# Patient Record
Sex: Male | Born: 1965 | Race: White | Hispanic: No | Marital: Married | Smoking: Never smoker
Health system: Southern US, Community
[De-identification: ages and names within clinical notes are randomized; demographics above are authoritative.]

## PROBLEM LIST (undated history)

## (undated) DIAGNOSIS — I2119 ST elevation (STEMI) myocardial infarction involving other coronary artery of inferior wall: Secondary | ICD-10-CM

## (undated) DIAGNOSIS — Z8673 Personal history of transient ischemic attack (TIA), and cerebral infarction without residual deficits: Secondary | ICD-10-CM

## (undated) DIAGNOSIS — M47815 Spondylosis without myelopathy or radiculopathy, thoracolumbar region: Secondary | ICD-10-CM

## (undated) DIAGNOSIS — E785 Hyperlipidemia, unspecified: Secondary | ICD-10-CM

## (undated) DIAGNOSIS — E119 Type 2 diabetes mellitus without complications: Secondary | ICD-10-CM

## (undated) DIAGNOSIS — I5032 Chronic diastolic (congestive) heart failure: Secondary | ICD-10-CM

## (undated) DIAGNOSIS — M479 Spondylosis, unspecified: Secondary | ICD-10-CM

## (undated) DIAGNOSIS — E1140 Type 2 diabetes mellitus with diabetic neuropathy, unspecified: Secondary | ICD-10-CM

## (undated) DIAGNOSIS — I2510 Atherosclerotic heart disease of native coronary artery without angina pectoris: Secondary | ICD-10-CM

## (undated) DIAGNOSIS — K219 Gastro-esophageal reflux disease without esophagitis: Secondary | ICD-10-CM

## (undated) DIAGNOSIS — Z9861 Coronary angioplasty status: Principal | ICD-10-CM

## (undated) DIAGNOSIS — I255 Ischemic cardiomyopathy: Secondary | ICD-10-CM

## (undated) DIAGNOSIS — I10 Essential (primary) hypertension: Secondary | ICD-10-CM

## (undated) HISTORY — DX: Spondylosis, unspecified: M47.9

## (undated) HISTORY — PX: TRANSTHORACIC ECHOCARDIOGRAM: SHX275

## (undated) HISTORY — DX: Spondylosis without myelopathy or radiculopathy, thoracolumbar region: M47.815

## (undated) HISTORY — DX: Atherosclerotic heart disease of native coronary artery without angina pectoris: I25.10

## (undated) HISTORY — DX: Ischemic cardiomyopathy: I25.5

## (undated) HISTORY — DX: Essential (primary) hypertension: I10

## (undated) HISTORY — DX: Type 2 diabetes mellitus with diabetic neuropathy, unspecified: E11.40

## (undated) HISTORY — DX: ST elevation (STEMI) myocardial infarction involving other coronary artery of inferior wall: I21.19

## (undated) HISTORY — DX: Hyperlipidemia, unspecified: E78.5

## (undated) HISTORY — DX: Gastro-esophageal reflux disease without esophagitis: K21.9

## (undated) HISTORY — DX: Chronic diastolic (congestive) heart failure: I50.32

## (undated) HISTORY — DX: Coronary angioplasty status: Z98.61

## (undated) HISTORY — DX: Personal history of transient ischemic attack (TIA), and cerebral infarction without residual deficits: Z86.73

## (undated) HISTORY — DX: Type 2 diabetes mellitus without complications: E11.9

---

## 2010-02-17 DIAGNOSIS — Z8673 Personal history of transient ischemic attack (TIA), and cerebral infarction without residual deficits: Secondary | ICD-10-CM

## 2010-02-17 HISTORY — DX: Personal history of transient ischemic attack (TIA), and cerebral infarction without residual deficits: Z86.73

## 2014-07-17 ENCOUNTER — Encounter (HOSPITAL_BASED_OUTPATIENT_CLINIC_OR_DEPARTMENT_OTHER): Payer: Self-pay | Admitting: Emergency Medicine

## 2014-07-17 ENCOUNTER — Emergency Department (HOSPITAL_BASED_OUTPATIENT_CLINIC_OR_DEPARTMENT_OTHER)
Admission: EM | Admit: 2014-07-17 | Discharge: 2014-07-17 | Disposition: A | Discharge status: Home / Self Care | Payer: BC Managed Care – PPO | Attending: Emergency Medicine | Admitting: Emergency Medicine

## 2014-07-17 DIAGNOSIS — Z79899 Other long term (current) drug therapy: Secondary | ICD-10-CM | POA: Insufficient documentation

## 2014-07-17 DIAGNOSIS — Z8673 Personal history of transient ischemic attack (TIA), and cerebral infarction without residual deficits: Secondary | ICD-10-CM | POA: Insufficient documentation

## 2014-07-17 DIAGNOSIS — E7800 Pure hypercholesterolemia, unspecified: Secondary | ICD-10-CM | POA: Insufficient documentation

## 2014-07-17 DIAGNOSIS — R21 Rash and other nonspecific skin eruption: Secondary | ICD-10-CM | POA: Insufficient documentation

## 2014-07-17 DIAGNOSIS — Z794 Long term (current) use of insulin: Secondary | ICD-10-CM | POA: Insufficient documentation

## 2014-07-17 DIAGNOSIS — B029 Zoster without complications: Secondary | ICD-10-CM

## 2014-07-17 DIAGNOSIS — E1149 Type 2 diabetes mellitus with other diabetic neurological complication: Secondary | ICD-10-CM | POA: Insufficient documentation

## 2014-07-17 DIAGNOSIS — R109 Unspecified abdominal pain: Secondary | ICD-10-CM | POA: Insufficient documentation

## 2014-07-17 MED ORDER — VALACYCLOVIR HCL 1 G PO TABS: 1000.0000 mg | ORAL_TABLET | Freq: Three times a day (TID) | ORAL | Status: DC

## 2014-07-17 NOTE — ED Provider Notes (Signed)
CSN: [id number]     Arrival date & time 07/17/14  1859 History  This chart was scribed for Audree Camel, MD by Luisa Dago, ED Scribe. This patient was seen in room MH10/MH10 and the patient's care was started at 8:31 PM.    Chief Complaint  Patient presents with  . Rash    The history is provided by the patient. No language interpreter was used.   HPI Comments: Jonathan Clayton is a 48 y.o. male with a history of DM presents to the Emergency Department complaining of a worsening rash that he first noted approximately 2 days ago. Pt states that his symptoms started with a sharp pain to his abdomen and radiated up his back one week ago. Then days later he noticed a rash developing along the area of pain. Pt states that it hurts to lay on his back. Jonathan Clayton states that the pain is worsened by touching. He reports taking Zantac with no relief. Denies being on any steroids, fever, chills, nausea, or emesis. Has diabetes.  Past Medical History  Diagnosis Date  . Diabetes mellitus without complication   . Diabetic neuropathy   . Stroke   . High cholesterol    History reviewed. No pertinent past surgical history. No family history on file. History  Substance Use Topics  . Smoking status: Never Smoker   . Smokeless tobacco: Not on file  . Alcohol Use: No    Review of Systems  Constitutional: Negative for fever and chills.  Cardiovascular: Negative for chest pain.  Gastrointestinal: Negative for nausea, vomiting and abdominal pain.  Genitourinary: Positive for flank pain. Negative for dysuria and hematuria.  Skin: Positive for rash.  All other systems reviewed and are negative.  Allergies  Review of patient's allergies indicates no known allergies.  Home Medications   Prior to Admission medications   Medication Sig Start Date End Date Taking? Authorizing Provider  carvedilol (COREG) 25 MG tablet Take 25 mg by mouth 2 (two) times daily with a meal.   Yes Historical Provider, MD   gabapentin (NEURONTIN) 600 MG tablet Take 600 mg by mouth 2 (two) times daily.   Yes Historical Provider, MD  Insulin Lispro, Human, (HUMALOG [place]) Inject into the skin.   Yes Historical Provider, MD  lisinopril (PRINIVIL,ZESTRIL) 40 MG tablet Take 40 mg by mouth daily.   Yes Historical Provider, MD  lovastatin (MEVACOR) 40 MG tablet Take 40 mg by mouth at bedtime.   Yes Historical Provider, MD  ranitidine (ZANTAC) 75 MG tablet Take 75 mg by mouth 2 (two) times daily.   Yes Historical Provider, MD   BP 220/112  Pulse 89  Temp(Src) 98.8 F (37.1 C) (Oral)  Resp 20  Ht 5\' 11"  (1.803 m)  Wt 240 lb (108.863 kg)  BMI 33.49 kg/m2  SpO2 98%  Physical Exam  Nursing note and vitals reviewed. Constitutional: He is oriented to person, place, and time. He appears well-developed and well-nourished. No distress.  HENT:  Head: Normocephalic and atraumatic.  Cardiovascular: Normal rate.   Pulmonary/Chest: Effort normal. No respiratory distress.  Abdominal: There is tenderness (minimal tenderness overlying rash).  Neurological: He is alert and oriented to person, place, and time.  Skin: Skin is warm and dry. Rash noted.  Patchy vesicular rash c/w shingles from right low back to right lower quadrant. No open sores.  Psychiatric: He has a normal mood and affect. His behavior is normal.    ED Course  Procedures (including critical care time)  DIAGNOSTIC STUDIES: Oxygen Saturation is 98% on RA, normal by my interpretation.    COORDINATION OF CARE: 8:34 PM- Advised pt to take Tylenol for pain control. Pt advised of plan for treatment and pt agrees.  Labs Review Labs Reviewed - No data to display  Imaging Review No results found.   EKG Interpretation None      MDM   Final diagnoses:  Shingles rash    Patient's rash is consistent with varicella zoster. His symptoms started overall one week ago with vague pain. He is not having any urinary symptoms. His pain overlies only the rash. At  this time we'll treat with valacyclovir recommend followup with his PCP. Patient is also on hypertensive medications for known hypertension. His blood pressure was 220/112 he is currently not having any symptoms. At this time I will recommend that he continue to take his medicines as prescribed and follow up with his PCP.  I personally performed the services described in this documentation, which was scribed in my presence. The recorded information has been reviewed and is accurate.    Audree CamelScott T Malan Werk, MD 07/17/14 [phone]

## 2014-07-17 NOTE — ED Notes (Signed)
C/o pain to rt side   At times radiating into rt abd and rt back  X 1.5 weeks   w rash onset about 1 week ago  ? Swelling to rt side

## 2014-07-17 NOTE — ED Notes (Addendum)
Pt states he is stressed and is due to bp meds  md aware

## 2014-07-17 NOTE — ED Notes (Signed)
C/o rash and pain to right side of abd x 1-2 weeks

## 2014-07-17 NOTE — Discharge Instructions (Signed)

## 2014-08-07 ENCOUNTER — Emergency Department (HOSPITAL_BASED_OUTPATIENT_CLINIC_OR_DEPARTMENT_OTHER): Payer: BC Managed Care – PPO

## 2014-08-07 ENCOUNTER — Encounter (HOSPITAL_BASED_OUTPATIENT_CLINIC_OR_DEPARTMENT_OTHER): Payer: Self-pay | Admitting: Emergency Medicine

## 2014-08-07 ENCOUNTER — Other Ambulatory Visit: Payer: Self-pay

## 2014-08-07 ENCOUNTER — Ambulatory Visit (HOSPITAL_COMMUNITY): Admit: 2014-08-07 | Payer: Self-pay | Admitting: Cardiology

## 2014-08-07 ENCOUNTER — Encounter (HOSPITAL_COMMUNITY)
Admission: EM | Disposition: A | Discharge status: Home / Self Care | Payer: BC Managed Care – PPO | Source: Home / Self Care | Attending: Cardiology

## 2014-08-07 ENCOUNTER — Inpatient Hospital Stay (HOSPITAL_BASED_OUTPATIENT_CLINIC_OR_DEPARTMENT_OTHER)
Admission: EM | Admit: 2014-08-07 | Discharge: 2014-08-11 | DRG: 246 | Disposition: A | Discharge status: Home / Self Care | Payer: BC Managed Care – PPO | Attending: Cardiology | Admitting: Cardiology

## 2014-08-07 DIAGNOSIS — S80862A Insect bite (nonvenomous), left lower leg, initial encounter: Secondary | ICD-10-CM

## 2014-08-07 DIAGNOSIS — S90569A Insect bite (nonvenomous), unspecified ankle, initial encounter: Secondary | ICD-10-CM | POA: Diagnosis present

## 2014-08-07 DIAGNOSIS — I2510 Atherosclerotic heart disease of native coronary artery without angina pectoris: Secondary | ICD-10-CM | POA: Insufficient documentation

## 2014-08-07 DIAGNOSIS — E1142 Type 2 diabetes mellitus with diabetic polyneuropathy: Secondary | ICD-10-CM | POA: Diagnosis present

## 2014-08-07 DIAGNOSIS — T63391A Toxic effect of venom of other spider, accidental (unintentional), initial encounter: Secondary | ICD-10-CM | POA: Diagnosis present

## 2014-08-07 DIAGNOSIS — I2109 ST elevation (STEMI) myocardial infarction involving other coronary artery of anterior wall: Secondary | ICD-10-CM | POA: Diagnosis present

## 2014-08-07 DIAGNOSIS — L0292 Furuncle, unspecified: Secondary | ICD-10-CM

## 2014-08-07 DIAGNOSIS — Z794 Long term (current) use of insulin: Secondary | ICD-10-CM

## 2014-08-07 DIAGNOSIS — W57XXXA Bitten or stung by nonvenomous insect and other nonvenomous arthropods, initial encounter: Secondary | ICD-10-CM | POA: Diagnosis present

## 2014-08-07 DIAGNOSIS — Z8673 Personal history of transient ischemic attack (TIA), and cerebral infarction without residual deficits: Secondary | ICD-10-CM | POA: Diagnosis not present

## 2014-08-07 DIAGNOSIS — I2582 Chronic total occlusion of coronary artery: Secondary | ICD-10-CM | POA: Diagnosis present

## 2014-08-07 DIAGNOSIS — T6391XA Toxic effect of contact with unspecified venomous animal, accidental (unintentional), initial encounter: Secondary | ICD-10-CM | POA: Diagnosis present

## 2014-08-07 DIAGNOSIS — E785 Hyperlipidemia, unspecified: Secondary | ICD-10-CM | POA: Diagnosis present

## 2014-08-07 DIAGNOSIS — E1149 Type 2 diabetes mellitus with other diabetic neurological complication: Secondary | ICD-10-CM | POA: Diagnosis present

## 2014-08-07 DIAGNOSIS — L989 Disorder of the skin and subcutaneous tissue, unspecified: Secondary | ICD-10-CM

## 2014-08-07 DIAGNOSIS — I10 Essential (primary) hypertension: Secondary | ICD-10-CM | POA: Diagnosis present

## 2014-08-07 DIAGNOSIS — I213 ST elevation (STEMI) myocardial infarction of unspecified site: Secondary | ICD-10-CM

## 2014-08-07 DIAGNOSIS — Z9861 Coronary angioplasty status: Secondary | ICD-10-CM

## 2014-08-07 DIAGNOSIS — I2119 ST elevation (STEMI) myocardial infarction involving other coronary artery of inferior wall: Secondary | ICD-10-CM

## 2014-08-07 DIAGNOSIS — R079 Chest pain, unspecified: Secondary | ICD-10-CM | POA: Diagnosis present

## 2014-08-07 DIAGNOSIS — I959 Hypotension, unspecified: Secondary | ICD-10-CM | POA: Diagnosis not present

## 2014-08-07 DIAGNOSIS — Z113 Encounter for screening for infections with a predominantly sexual mode of transmission: Secondary | ICD-10-CM

## 2014-08-07 DIAGNOSIS — E1159 Type 2 diabetes mellitus with other circulatory complications: Secondary | ICD-10-CM

## 2014-08-07 HISTORY — DX: ST elevation (STEMI) myocardial infarction involving other coronary artery of inferior wall: I21.19

## 2014-08-07 HISTORY — PX: PERCUTANEOUS CORONARY STENT INTERVENTION (PCI-S): SHX6016

## 2014-08-07 HISTORY — PX: LEFT HEART CATHETERIZATION WITH CORONARY ANGIOGRAM: SHX5451

## 2014-08-07 HISTORY — DX: Coronary angioplasty status: Z98.61

## 2014-08-07 HISTORY — DX: Atherosclerotic heart disease of native coronary artery without angina pectoris: I25.10

## 2014-08-07 LAB — COMPREHENSIVE METABOLIC PANEL
ALBUMIN: 2.8 g/dL — AB (ref 3.5–5.2)
ALT: 17 U/L (ref 0–53)
ANION GAP: 16 — AB (ref 5–15)
AST: 34 U/L (ref 0–37)
Alkaline Phosphatase: 82 U/L (ref 39–117)
BUN: 19 mg/dL (ref 6–23)
CHLORIDE: 97 meq/L (ref 96–112)
CO2: 22 mEq/L (ref 19–32)
Calcium: 9.7 mg/dL (ref 8.4–10.5)
Creatinine, Ser: 1.3 mg/dL (ref 0.50–1.35)
GFR calc Af Amer: 73 mL/min — ABNORMAL LOW (ref >90)
GFR calc non Af Amer: 63 mL/min — ABNORMAL LOW (ref >90)
Glucose, Bld: 294 mg/dL — ABNORMAL HIGH (ref 70–99)
Potassium: 4.5 mEq/L (ref 3.7–5.3)
Sodium: 135 mEq/L — ABNORMAL LOW (ref 137–147)
TOTAL PROTEIN: 7.4 g/dL (ref 6.0–8.3)
Total Bilirubin: 0.5 mg/dL (ref 0.3–1.2)

## 2014-08-07 LAB — CBC WITH DIFFERENTIAL/PLATELET
BASOS ABS: 0.1 10*3/uL (ref 0.0–0.1)
BASOS PCT: 1 % (ref 0–1)
EOS ABS: 0.1 10*3/uL (ref 0.0–0.7)
Eosinophils Relative: 1 % (ref 0–5)
HCT: 39.8 % (ref 39.0–52.0)
HEMOGLOBIN: 13.6 g/dL (ref 13.0–17.0)
Lymphocytes Relative: 17 % (ref 12–46)
Lymphs Abs: 1.9 10*3/uL (ref 0.7–4.0)
MCH: 30.2 pg (ref 26.0–34.0)
MCHC: 34.2 g/dL (ref 30.0–36.0)
MCV: 88.4 fL (ref 78.0–100.0)
MONOS PCT: 12 % (ref 3–12)
Monocytes Absolute: 1.4 10*3/uL — ABNORMAL HIGH (ref 0.1–1.0)
NEUTROS ABS: 7.8 10*3/uL — AB (ref 1.7–7.7)
Neutrophils Relative %: 69 % (ref 43–77)
Platelets: 323 10*3/uL (ref 150–400)
RBC: 4.5 MIL/uL (ref 4.22–5.81)
RDW: 13.9 % (ref 11.5–15.5)
WBC: 11.2 10*3/uL — ABNORMAL HIGH (ref 4.0–10.5)

## 2014-08-07 LAB — CBC
HEMATOCRIT: 38.3 % — AB (ref 39.0–52.0)
Hemoglobin: 13.2 g/dL (ref 13.0–17.0)
MCH: 30.2 pg (ref 26.0–34.0)
MCHC: 34.5 g/dL (ref 30.0–36.0)
MCV: 87.6 fL (ref 78.0–100.0)
PLATELETS: 323 10*3/uL (ref 150–400)
RBC: 4.37 MIL/uL (ref 4.22–5.81)
RDW: 14 % (ref 11.5–15.5)
WBC: 11 10*3/uL — AB (ref 4.0–10.5)

## 2014-08-07 LAB — PROTIME-INR
INR: 1.17 (ref 0.00–1.49)
PROTHROMBIN TIME: 14.9 s (ref 11.6–15.2)

## 2014-08-07 LAB — TROPONIN I: TROPONIN I: 5.28 ng/mL — AB (ref <0.30)

## 2014-08-07 LAB — APTT: APTT: 35 s (ref 24–37)

## 2014-08-07 LAB — CREATININE, SERUM
CREATININE: 1.02 mg/dL (ref 0.50–1.35)
GFR, EST NON AFRICAN AMERICAN: 85 mL/min — AB (ref >90)

## 2014-08-07 LAB — GLUCOSE, CAPILLARY
Glucose-Capillary: 195 mg/dL — ABNORMAL HIGH (ref 70–99)
Glucose-Capillary: 206 mg/dL — ABNORMAL HIGH (ref 70–99)

## 2014-08-07 LAB — MRSA PCR SCREENING: MRSA by PCR: NEGATIVE

## 2014-08-07 LAB — POCT ACTIVATED CLOTTING TIME: Activated Clotting Time: 501 seconds

## 2014-08-07 SURGERY — LEFT HEART CATHETERIZATION WITH CORONARY ANGIOGRAM
Anesthesia: LOCAL

## 2014-08-07 MED ORDER — ONDANSETRON HCL 4 MG/2ML IJ SOLN
4.0000 mg | Freq: Four times a day (QID) | INTRAMUSCULAR | Status: DC | PRN
  Administered 2014-08-07: 4 mg via INTRAVENOUS
  Filled 2014-08-07: qty 2

## 2014-08-07 MED ORDER — ASPIRIN 81 MG PO CHEW
81.0000 mg | CHEWABLE_TABLET | Freq: Every day | ORAL | Status: DC
  Administered 2014-08-08 – 2014-08-11 (×4): 81 mg via ORAL
  Filled 2014-08-07 (×4): qty 1

## 2014-08-07 MED ORDER — GABAPENTIN 600 MG PO TABS
600.0000 mg | ORAL_TABLET | Freq: Two times a day (BID) | ORAL | Status: DC
  Administered 2014-08-07 – 2014-08-11 (×8): 600 mg via ORAL
  Filled 2014-08-07 (×9): qty 1

## 2014-08-07 MED ORDER — CARVEDILOL 12.5 MG PO TABS
12.5000 mg | ORAL_TABLET | Freq: Once | ORAL | Status: AC
  Administered 2014-08-07: 12.5 mg via ORAL
  Filled 2014-08-07: qty 1

## 2014-08-07 MED ORDER — TIROFIBAN HCL IV 5 MG/100ML
0.1500 ug/kg/min | INTRAVENOUS | Status: AC
  Administered 2014-08-07 (×2): 0.15 ug/kg/min via INTRAVENOUS
  Filled 2014-08-07 (×2): qty 100

## 2014-08-07 MED ORDER — HEPARIN SODIUM (PORCINE) 5000 UNIT/ML IJ SOLN
4000.0000 [IU] | Freq: Once | INTRAMUSCULAR | Status: AC
  Administered 2014-08-07: 4000 [IU] via INTRAVENOUS

## 2014-08-07 MED ORDER — HEPARIN SODIUM (PORCINE) 5000 UNIT/ML IJ SOLN
INTRAMUSCULAR | Status: AC
  Administered 2014-08-07: 4000 [IU] via INTRAVENOUS
  Filled 2014-08-07: qty 1

## 2014-08-07 MED ORDER — HYDRALAZINE HCL 20 MG/ML IJ SOLN
10.0000 mg | INTRAMUSCULAR | Status: DC | PRN
  Administered 2014-08-07: 10 mg via INTRAVENOUS
  Filled 2014-08-07: qty 1

## 2014-08-07 MED ORDER — HEPARIN SODIUM (PORCINE) 5000 UNIT/ML IJ SOLN
5000.0000 [IU] | Freq: Three times a day (TID) | INTRAMUSCULAR | Status: DC
  Administered 2014-08-08 – 2014-08-10 (×9): 5000 [IU] via SUBCUTANEOUS
  Filled 2014-08-07 (×13): qty 1

## 2014-08-07 MED ORDER — ACETAMINOPHEN 325 MG PO TABS
650.0000 mg | ORAL_TABLET | ORAL | Status: DC | PRN
  Administered 2014-08-07 – 2014-08-10 (×3): 650 mg via ORAL
  Filled 2014-08-07 (×3): qty 2

## 2014-08-07 MED ORDER — HEPARIN (PORCINE) IN NACL 100-0.45 UNIT/ML-% IJ SOLN: 1200.0000 [IU]/h | INTRAMUSCULAR | Status: DC

## 2014-08-07 MED ORDER — MORPHINE SULFATE 2 MG/ML IJ SOLN
2.0000 mg | INTRAMUSCULAR | Status: DC | PRN
  Administered 2014-08-07: 2 mg via INTRAVENOUS
  Filled 2014-08-07: qty 1

## 2014-08-07 MED ORDER — CARVEDILOL 12.5 MG PO TABS
12.5000 mg | ORAL_TABLET | Freq: Two times a day (BID) | ORAL | Status: DC
  Administered 2014-08-07: 12.5 mg via ORAL
  Filled 2014-08-07 (×3): qty 1

## 2014-08-07 MED ORDER — CARVEDILOL 25 MG PO TABS
25.0000 mg | ORAL_TABLET | Freq: Two times a day (BID) | ORAL | Status: DC
  Administered 2014-08-08 – 2014-08-11 (×7): 25 mg via ORAL
  Filled 2014-08-07 (×9): qty 1

## 2014-08-07 MED ORDER — INSULIN ASPART 100 UNIT/ML ~~LOC~~ SOLN
2.0000 [IU] | SUBCUTANEOUS | Status: DC
  Administered 2014-08-07: 6 [IU] via SUBCUTANEOUS
  Administered 2014-08-07: 4 [IU] via SUBCUTANEOUS
  Administered 2014-08-08 (×3): 6 [IU] via SUBCUTANEOUS
  Administered 2014-08-08: 4 [IU] via SUBCUTANEOUS
  Administered 2014-08-08: 6 [IU] via SUBCUTANEOUS
  Administered 2014-08-09: 4 [IU] via SUBCUTANEOUS

## 2014-08-07 MED ORDER — FAMOTIDINE 20 MG PO TABS
20.0000 mg | ORAL_TABLET | Freq: Two times a day (BID) | ORAL | Status: DC
  Administered 2014-08-07 – 2014-08-11 (×8): 20 mg via ORAL
  Filled 2014-08-07 (×9): qty 1

## 2014-08-07 MED ORDER — ASPIRIN 81 MG PO CHEW
324.0000 mg | CHEWABLE_TABLET | Freq: Once | ORAL | Status: AC
  Administered 2014-08-07: 324 mg via ORAL
  Filled 2014-08-07: qty 4

## 2014-08-07 MED ORDER — SODIUM CHLORIDE 0.9 % IJ SOLN: 3.0000 mL | INTRAMUSCULAR | Status: DC | PRN

## 2014-08-07 MED ORDER — SODIUM CHLORIDE 0.9 % IJ SOLN
3.0000 mL | Freq: Two times a day (BID) | INTRAMUSCULAR | Status: DC
  Administered 2014-08-08: 09:00:00 via INTRAVENOUS
  Administered 2014-08-08 – 2014-08-11 (×6): 3 mL via INTRAVENOUS

## 2014-08-07 MED ORDER — SODIUM CHLORIDE 0.9 % IV SOLN: 250.0000 mL | INTRAVENOUS | Status: DC | PRN

## 2014-08-07 MED ORDER — SODIUM CHLORIDE 0.9 % IV SOLN
1.0000 mL/kg/h | INTRAVENOUS | Status: AC
  Administered 2014-08-07: 1 mL/kg/h via INTRAVENOUS

## 2014-08-07 MED ORDER — LISINOPRIL 20 MG PO TABS
20.0000 mg | ORAL_TABLET | Freq: Every day | ORAL | Status: DC
  Filled 2014-08-07: qty 1

## 2014-08-07 MED ORDER — TICAGRELOR 90 MG PO TABS
90.0000 mg | ORAL_TABLET | Freq: Two times a day (BID) | ORAL | Status: DC
  Administered 2014-08-07 – 2014-08-11 (×8): 90 mg via ORAL
  Filled 2014-08-07 (×9): qty 1

## 2014-08-07 NOTE — ED Provider Notes (Addendum)
Asked to review ekg on patient after elicited in ros that patient had chest pain beginning two days ago.  History/physical exam/procedure(s) were performed by non-physician practitioner and as supervising physician I was immediately available for consultation/collaboration. I have reviewed all notes and am in agreement with care and plan.   Hilario Quarry, MD 08/07/14 1635   EKG Interpretation  Date/Time:  Friday August 07 2014 14:05:16 EDT Ventricular Rate:  86 PR Interval:  170 QRS Duration: 98 QT Interval:  366 QTC Calculation: 437 R Axis:   30 Text Interpretation:   Critical Test Result: STEMI Normal sinus rhythm Anterior infarct , possibly acute Inferolateral injury pattern  ACUTE MI / STEMI  Abnormal ECG Confirmed by Geneieve Duell MD, Duwayne Heck ([id number]) on 08/07/2014 2:12:54 PM      History/physical exam/procedure(s) were performed by non-physician practitioner and as supervising physician I was immediately available for consultation/collaboration. I have reviewed all notes and am in agreement with care and plan.   Hilario Quarry, MD 08/07/14 [phone]

## 2014-08-07 NOTE — ED Notes (Addendum)
Lab called to notify of Troponin 5.28. Dr Rosalia Hammers made aware.

## 2014-08-07 NOTE — Progress Notes (Signed)
eLink Physician-Brief Progress Note Patient Name: Jonathan Clayton DOB: 03-Sep-1966 MRN: [id number]   Date of Service  08/07/2014  HPI/Events of Note  48 y.o. male with PMHxn of IDDM (w/ neuropathy), HLD, HTN & prio CVA (no residual R side Sx) recently moved to [place].  About  3 d ago had prolonged episode of substernal-upper chest pressure with dyspnea & sweating. About 30+ min. Since then has had increasing exertional dyspnea & intermittent chest pressure, but did not seek medical assistance until today when he went to [facility] for what appears to be a Spider Bite on R foot.  On ROS CP noted - EKG checked with extensive Inferior & Lateral STE. Currently is CP free, but did note dyspnea in ER.  Went to the cath lab with the following findings: Impression:  1. 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 based on the EKG showing inferolateral ST elevations, it was unclear what the culprit lesion was, therefore decision was made to intervene on the circumflex an attempt the LAD if able to wire.   2. Left main is relatively free of disease. LAD has early mid 50% stenosis just before the bifurcation in the D2 and LAD. The LAD is occluded in the midsection of her large septal perforator. D2 is occluded also at a acute bend.   3.The complex mostly courses as a lateral OM 2. There is a small caliber high OM 1 that courses as a ramus intermedius that is disease in both the small in diameter. There is TIMI 1-2 flow, it is too small for PCI.  4. Successful PCI of the mid circumflex OM2 with 2 overlapping DES   5. Successful PCI of mid LAD with suboptimal distal result, potentially due to distal embolization and a small-caliber vessel   6.Residual occluded mid D2 which is a moderate caliber, albeit tortuous vessel. There was faint TIMI 1 flow beyond what appeared to be occlusion after wiring the vessel.   7. Widely patent very large RCA  with distal RPDA E. tandem 60% stenoses. There are at least 3 small PL branches with one large major PL branch   8. Hemodynamics:  - Central AoP: 99/69/83 mmHg  - LVP/EDP: 103/5/9 mmHg  Recommendations:  - Transferred to the ICU for post radial Care. - Aggrastat for 6 hours with hopes to clear up some of the thrombus burden in the distal LAD and diagonal.  - Aspirin plus Brilinta for minimum one year. - Restart multiple medications, will reduce carvedilol to 12.5 mg twice a day, lisinopril to 20 mg daily and hold losartan.  - He is on a statin.  - Check 2-D echocardiogram  - If recurrent anginal symptoms persists, would consider potentially intervening on the diagonal branch in a staged manner.  eICU Interventions  48 yo STEMI s\p cath lab with above recs - cont to follow up in interventional cards recs - monitor of chest pain, may need re-invention if persistent - cont with BBlocker, ACEI, ASA  IDDM - on inuslin at home, hold PO meds - start SSI      Intervention Category Evaluation Type: New Patient Evaluation  Abbie Jablon 08/07/2014, 5:59 PM

## 2014-08-07 NOTE — Progress Notes (Deleted)
Elink MD notified of Pt's current B/P 

## 2014-08-07 NOTE — ED Notes (Signed)
Possible insect bite to left lower leg.  Noted red, bruised area to shin.  No known fever but was sweating this am.

## 2014-08-07 NOTE — Progress Notes (Signed)
ANTICOAGULATION CONSULT NOTE - Initial Consult  Pharmacy Consult for heparin Indication: chest pain/ACS  No Known Allergies  Patient Measurements: Height: 5\' 11"  (180.3 cm) Weight: 240 lb (108.863 kg) IBW/kg (Calculated) : 75.3 Heparin Dosing Weight: 98.5 kg  Vital Signs: Temp: 98.2 F (36.8 C) (08/28 1312) BP: 141/92 mmHg (08/28 1411) Pulse Rate: 86 (08/28 1312)  Labs:  Recent Labs  08/07/14 1415  HGB 13.6  HCT 39.8  PLT 323  APTT 35  LABPROT 14.9  INR 1.17    CrCl is unknown because no creatinine reading has been taken.   Medical History: Past Medical History  Diagnosis Date  . Diabetes mellitus without complication   . Diabetic neuropathy   . Stroke   . High cholesterol     Medications:   (Not in a hospital admission)  Assessment: 48 yo male initially presented due to concern of insect bite.  Pt then mentioned he was having chest pain, Code STEMI called, anticipate transfer to [facility] for cardiac cath. Baseline INR 1.1, CBC wnl.  Pt had aspirin 324 mg and heparin bolus at [facility] at 1415.  Goal of Therapy:  Heparin level 0.3-0.7 units/ml Monitor platelets by anticoagulation protocol: Yes   Plan:  Heparin gtt at 1200 units/hr Daily HL, CBC F/u 6hr heparin level pending cath     Agapito Games, PharmD, BCPS Clinical Pharmacist Pager: [phone] 08/07/2014 2:49 PM

## 2014-08-07 NOTE — ED Notes (Signed)
Pt transferred to [facility] via EMS  

## 2014-08-07 NOTE — CV Procedure (Signed)
CARDIAC CATHETERIZATION AND PERCUTANEOUS CORONARY INTERVENTION REPORT  NAME:  Jonathan Clayton   MRN: [id number] DOB:  Dec 24, 1965   ADMIT DATE: 08/07/2014 Procedure Date: 08/07/2014  INTERVENTIONAL CARDIOLOGIST: Leonie Man, M.D., MS PRIMARY CARE PROVIDER: No PCP Per Patient PRIMARY CARDIOLOGIST: Leonie Man, MD  PATIENT:  Jonathan Clayton is a 48 y.o. male the past medical history of type 2 diabetes on insulin with neuropathy, hypertension hyperlipidemia and prior stroke mild residual right-sided defect. Include [place], [place] with his wife. He states that about 3 or 4 days ago while visiting family in [place], he noted severe substernal/upper chest pressure and pain with dyspnea and sweating it lasted well over a half an hour.  Since then he has been having exertional dyspnea and chest pressure. He did not seek medical advice for this, but his wife indicates that he has been bothered by this ever since the trip back. He Presented to [facility] Emergency room earlier today with complains of what thought was a bug bite on the left leg. Review of systems revealed chest pain. EKG revealed extensive [place] elevations in leads 1, 2, the V2 to V 5.  He was pain-free upon arrival to [facility] ER. Based on the extent of  elevations he was taken urgently to the cardiac catheterization lab.  PRE-OPERATIVE DIAGNOSIS:    Likely Sub-Acute presentation of AnteroLateral STEMI  PROCEDURES PERFORMED:    Left Heart Catheterization with Native Coronary Angiography  via Right RADIAL Artery   PERCUTANEOUS CORONARY INTERVENTION to mid Circumflex-OM 2 tandem 80% stenoses with intervening 50%- Promus Premier DES 2.75 mm x 16 mm and 3.0 mm x 24 mm (postdilated to 3 mm distally at the 25 mm proximally) -- reduced to 0%   PERCUTANEOUS CORONARY INTERVENTION to mid LAD 100% occlusion: 2 overlapping Promus Premier DES 2.25 mm x 20 mm and 2.25 mm 8 mm - postdilated to 2.4 mm - occlusion reduced to 0%, but TIMI 2  flow to apex.  PROCEDURE: The patient was brought to the 2nd [place] Cardiac Catheterization Lab in the fasting state and prepped and draped in the usual sterile fashion for RIGHT RADIAL artery access. A modified Allen's test was performed on the RIGHT wrist demonstrating excellent collateral flow for radial access.   Sterile technique was used including antiseptics, cap, gloves, gown, hand hygiene, mask and sheet. Skin prep: Chlorhexidine.   Consent: Risks of procedure as well as the alternatives and risks of each were explained to the (patient/caregiver). Consent for procedure obtained.   Time Out: Verified patient identification, verified procedure, site/side was marked, verified correct patient position, special equipment/implants available, medications/allergies/relevent history reviewed, required imaging and test results available. Performed.  Access:   RIGHT RADIAL Artery: 6 Fr Sheath -  fluoroscopically guided modified Seldinger Technique (Angiocath Micropuncture Kit)  Radial Cocktail - 10 mL   Left Heart Catheterization: Initially 5 Fr and 6 Fr Catheters advanced or exchanged over a long-exchange safety J-wire; TIG 4.0 catheter advanced first. Very tortuous innominate artery led to difficulty with engaging the coronaries. This led to delay any additional intervention. Additional delay was due to the difficulty in determining the true culprit lesion. Left Coronary Artery Cineangiography: 6Fr  XB LAD guide Catheter  Right Coronary Artery Cineangiography: 6 Fr Jimmye Norman R Guide Catheter   LV Hemodynamics: TIG 4.0  Sheath removed in the cardiac catheterization lab at TR band placement for hemostasis.  TR Band: 1710  Hours; 16 mL air  FINDINGS:  Hemodynamics:  Central AoP: 99/69/83 mmHg   LVP/EDP: 103/5/9 mmHg  Left Ventriculography: Deferred  Coronary Anatomy:  Dominance: Right  Left Main: Large caliber vessel that bifurcates into the LAD and Circumflex. Angiographic  normal the LAD: Large caliber vessel that tapers to a roughly 50% lesion after giving rise to 2 small diagonal branches and the major septal perforators. Just after this there is a bifurcation into the major D2 and a follow on LAD. Both vessels are occluded in the mid vessel just after the bifurcation.    D1: The first diagonal is not worth numbering, the second one is a small-caliber vessel with diffuse disease.  D2: Moderate to large caliber vessel is tortuous and 100% occluded after it gives rise to a proximal branch. Left Circumflex: Large caliber vessel that gives off a small AV groove circumflex branch but really terminates as a large lateral OM 2. There is a more proximal OM1 the courses as a Ramus Intermedius that is small in diameter and diffusely diseased.   OM 2: Large caliber major marginal branch with 2 tandem lesions that are roughly 80% in stenosis with intervening segment of 60%. This is over 35 mm.   RCA: Very large caliber, dominant vessel but is quite tortuous in its course. It bifurcates distally into the Right Posterior Descending Artery (RPDA) and the  Right Posterior AV Groove Branch (RPAV). Mild luminal irregularities, but widely patent.  RPDA: Large caliber vessel that reaches almost all the way to the apex. There are 2 tandem 60% lesions the distal vessel. Otherwise mild luminal irregularities the  RPL Sysytem:The RPAV begins her vessel it gives off 2 small posterior lateral branches and terminates as a large RPL 3 the covers the entire inferolateral wall. There is a smaller RPL4 that comes off of this branch. Minimal luminal irregularities through the entire system.  After reviewing the initial angiography, the actual culprit lesion could not be determined with Severe CX-OM2 and LAD+ D2 disease.  Due to the ambiguity of the presentation & diffuse STE on EKG, the decision was made to initially intervene on the OM lesions with the belief that the LAD & D2 occlusions could  be chronic.  This was after deliberation with interventional colleagues.  The plan was OM PCI followed by attempted wiring of the LAD & PCI if able.  Preparation were made to proceed with PCI on the OM2 lesions first.  Delay to initial device was attributed to a tortuous Innominate Artery making Arterial cannulation difficult.  Next was the difficulty in determining which lesions to intervene on due to ambiguity of anatomy.  Percutaneous Coronary Intervention:  Sheath exchanged for 6 Fr Guide: 6 Fr   XB LAD 3.5 Guidewire: BMW wire in LAD for Guide support, Prowater wire for Cx-OM2.  Lesions #1: Circumflex-OM2, tandem 80% lesions with intervening ~50-60%  Reduced to 0%, TIMI 3 flow pre & post. Predilation Balloon: BS Emerge 2.5 mm x 15 mm;   12 Atm x 20 Sec, Stent #1 : Promus Premier DES  2.75 mm x 16 mm; covering distal 70-80% lesion  14 Atm x 30 Sec, Stent #2 : Promus Premier DES  3.0 mm x 6m; covering proximal 80% lesion to overlap.  14 Atm x 30 Sec,  Inflated at overlap => 14 Atm x 30 Sec, Post-dilation Balloon: BS [place] Emerge 3.25 mm x 12 mm; Inflations at overlap & proximal Stent#2  14 Atm x 30 Sec, -- 2 inflations  Final Diameter: 3.3 mm proximal, 3.0 mm mid &  2.8 mm distal  Post deployment angiography in multiple views, with and without guidewire in place revealed excellent stent deployment and lesion coverage.  There was no evidence of dissection or perforation.  Attention was then turned to the LAD - both the LAD & D2 were successfully wired without difficulty, indicating that the occlusions may be more subacute.  As there were Anterior & Lateral STE on EKG, it was prudent to attempt PCI on the LAD with the potential to stage PCI of the D2 if Symptoms persist.  Lesion #2: Mid LAD - 100%- TIMI 0 flow - reduced to 0 % with TIMI 2 flow (sluggish apical flow)   Predilation Balloon: BS Emerge 2.0 mm x 12 mm;   10 Atm x 30 Sec, Stent #1 : Promus Premier DES  2.25 mm x 3m;  covering the original occlusion.  12 Atm x 30 Sec,  Post inflation revealed distal plaque shift to the distal stent edge not relieved with IC NTG -- a 2nd stent was used Stent #2 : Promus Premier DES  2.25 mm x 8 mm; covering distl stent edge   12 Atm x 30 Sec,  14 Atm x 30 at overlap   16 Atm x 30 at proximal Stent 1  Final Diameter 2.25 distal, 2.45 proximal   MEDICATIONS:  Anesthesia:  Local Lidocaine 2 ml  Sedation:  2 mg IV Versed, 50 mcg IV fentanyl ;   Omnipaque Contrast: 360 ml  Anticoagulation:  IV Heparin 4000 Units ; Angiomax Bolus & drip  Anti-Platelet Agent:  BRILINTA 180 mg Radial Cocktail: 5 mg Verapamil, 400 mcg NTG, 2 ml 2% Lidocaine in 10 ml NS  PATIENT DISPOSITION:    The patient was transferred to the PACU holding area in a hemodynamicaly stable, chest pain free condition.  The patient tolerated the procedure well, and there were no complications.  EBL:   < 10 ml  The patient was stable before, during, and after the procedure.  POST-OPERATIVE DIAGNOSIS:    Severe three-vessel diseasewith 100% occlusion of mid LAD and mid D2 with tandem 80-90% stenoses in mid OM 2. based on the EKG showing inferolateral ST elevations, it was unclear what the culprit lesion was, therefore decision was made to intervene on the circumflex an attempt the LAD if able to wire.  Successful PCI of the mid circumflex OM2 with 2 overlapping DES   Successful PCI of mid LAD with 2 overlapping DES with suboptimal distal result, potentially due to distal embolization and a small-caliber vessel  Residual occluded mid D2 which is a moderate caliber, albeit tortuous vessel. There was faint TIMI 1 flow beyond what appeared to be occlusion after wiring the vessel.   Normal LVEDP.  PLAN OF CARE:  [facility]. Follow Troponin levels.  DAPT x 1 yr; CM & CRH consult  6 hrs of Aggrestat in attempt to recanalize the apical LAD & D2.  If he has  persistent symptoms, would consider staged PCI of D2.  Aspirin plus Brilinta for minimum one year. Restart multiple medications, Carvedilol to 25 mg twice a day, lisinopril reduced to 20 mg daily and hold losartan until his post cath pressures improve.   He is on a statin.   Check 2-D echocardiogram  If BP stable & symptoms do not recur, would consider d/c by Monday AM.  Cannot forget about L Leg spider/bug bite.  Will need to determine if Abx needed or expectant management.   This was not  addressed in the acute setting.  Will defer to rounding team over the weekend.    Leonie Man, M.D., M.S. [facility] [address] [place], [place]  [address]  [phone]  08/07/2014 5:32 PM

## 2014-08-07 NOTE — H&P (Addendum)
History and Physical Interval Note:  NAME:  Jonathan Clayton   MRN: [id number] DOB:  August 23, 1966   ADMIT DATE: 08/07/2014   08/07/2014 3:22 PM  Bardo Giustino is a 48 y.o. 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 ~3 d ago had prolonged episode of SS-upper chest pressure with dyspnea & sweating.  ~30+ min.  Since then has had increasing exertional dyspnea & intermittent chest pressure, but did not seek medical assistance until today when he went to [facility] for what appears to be a Spider Bite on R foot. On ROS CP noted - EKG checked with extensive Inferior & Lateral STE.  Currently is CP free, but did note dyspnea in ER.    No N/V/D + chest pain &shortness of breath with exertio - not @ rest  No PND, orthopnea or edema.  No palpitations, lightheadedness, dizziness, weakness or syncope/near syncope. No TIA/amaurosis fugax symptoms. No melena, hematochezia, hematuria, or epstaxis. No claudication.   Past Medical History  Diagnosis Date  . Diabetes mellitus without complication   . Diabetic neuropathy   . Stroke   . High cholesterol    No past surgical history on file.  FAMHx: No family history on file.  SOCHx:  reports that he has never smoked. He does not have any smokeless tobacco history on file. He reports that he does not drink alcohol or use illicit drugs.  ALLERGIES: No Known Allergies  HOME MEDICATIONS: Prescriptions prior to admission  Medication Sig Dispense Refill  . carvedilol (COREG) 25 MG tablet Take 25 mg by mouth 2 (two) times daily with a meal.      . gabapentin (NEURONTIN) 600 MG tablet Take 600 mg by mouth 2 (two) times daily.      . Insulin Lispro, Human, (HUMALOG Nogal) Inject into the skin.      Marland Kitchen lisinopril (PRINIVIL,ZESTRIL) 40 MG tablet Take 40 mg by mouth daily.      Marland Kitchen lovastatin (MEVACOR) 40 MG tablet Take 40 mg by mouth at bedtime.      . ranitidine (ZANTAC) 75 MG  tablet Take 75 mg by mouth 2 (two) times daily.       Review of Systems  Constitutional: Negative for fever and chills.  Respiratory: Positive for sputum production.   Cardiovascular: Positive for chest pain, orthopnea and PND.       Exertional dyspnea  Gastrointestinal: Positive for nausea.  Musculoskeletal:       Left leg bug bite  All other systems reviewed and are negative.   PHYSICAL EXAM:Blood pressure 141/92, pulse 86, temperature 98.2 F (36.8 C), resp. rate 16, height 5\' 11"  (1.803 m), weight 240 lb (108.863 kg), SpO2 99.00%. General appearance: alert, cooperative, appears stated age, no distress and pale Neck: no adenopathy, no carotid bruit and no JVD Lungs: clear to auscultation bilaterally, normal percussion bilaterally and non-labored Heart: regular rate and rhythm, S1, S2 normal, no murmur, click, rub or gallop and normal apical impulse Abdomen: soft, non-tender; bowel sounds normal; no masses,  no organomegaly Extremities: extremities normal, atraumatic, no cyanosis or edema Pulses: 2+ and symmetric Neurologic: Grossly normal L LE spider bite   Adult ECG Report  Rate: 76 ;  Rhythm: normal sinus rhythm - extensive ST elevations in leads 1, 2, the tube through V5. -- Inferolateral STEMI  IMPRESSION & PLAN  Larry Bartos has presented today for surgery, with  the diagnosis of Anterolateral STEMI.   The various methods of treatment have been discussed with the patient and family.  The patients' history has been reviewed, patient examined, no change in status from most recent note, stable for surgery. I have reviewed the patients' chart and labs. Questions were answered to the patient's satisfaction.  Risks / Complications include, but not limited to: Death, MI, CVA/TIA, VF/VT (with defibrillation), Bradycardia (need for temporary pacer placement), contrast induced nephropathy, bleeding / bruising / hematoma / pseudoaneurysm, vascular or coronary injury (with possible  emergent CT or Vascular Surgery), adverse medication reactions, infection.    After consideration of risks, benefits and other options for treatment, the patient has verbally consented to Procedure(s):  LEFT HEART CATHETERIZATION AND CORONARY ANGIOGRAPHY +/- AD HOC PERCUTANEOUS CORONARY INTERVENTION   as a surgical intervention.   We will proceed with the planned procedure..  Following cath/PCI  --  will need to continue to treat bug bite on left leg.   HARDING,DAVID W [facility] [address] [place], [place]  [address]  [phone]  08/07/2014 3:22 PM

## 2014-08-07 NOTE — ED Notes (Signed)
Report called to [facility] in Cath Lab.

## 2014-08-07 NOTE — ED Provider Notes (Signed)
CSN: [id number]     Arrival date & time 08/07/14  1254 History   First MD Initiated Contact with Patient 08/07/14 1322     Chief Complaint  Patient presents with  . Insect Bite     (Consider location/radiation/quality/duration/timing/severity/associated sxs/prior Treatment) Patient is a 48 y.o. male presenting with chest pain. The history is provided by the patient. No language interpreter was used.  Chest Pain Pain location:  Substernal area, L chest and R chest Pain quality: aching and sharp   Pain radiates to:  Upper back Pain radiates to the back: yes   Pain severity:  Severe Onset quality:  Sudden Duration:  2 days Timing:  Constant Progression:  Resolved Chronicity:  New Context: lifting   Context: not breathing   Relieved by:  Rest Worsened by:  Nothing tried Ineffective treatments:  None tried Associated symptoms: no abdominal pain   Risk factors: diabetes mellitus, high cholesterol and hypertension   Pt presents for a bug bite on leg.   Pt has diabetes and is concerned about bite on leg.  Pt reports he felt sweaty this am and thought he had a fever.   Pt reports 2 days ago he had pain in his chest that went through to his back and both shoulders.  Pt reports pain has resolved.   Some discomfort yesterday.  No pain at all today.  Past Medical History  Diagnosis Date  . Diabetes mellitus without complication   . Diabetic neuropathy   . Stroke   . High cholesterol    No past surgical history on file. No family history on file. History  Substance Use Topics  . Smoking status: Never Smoker   . Smokeless tobacco: Not on file  . Alcohol Use: No    Review of Systems  Cardiovascular: Positive for chest pain.  Gastrointestinal: Negative for abdominal pain.  All other systems reviewed and are negative.     Allergies  Review of patient's allergies indicates no known allergies.  Home Medications   Prior to Admission medications   Medication Sig Start Date End  Date Taking? Authorizing Provider  carvedilol (COREG) 25 MG tablet Take 25 mg by mouth 2 (two) times daily with a meal.    Historical Provider, MD  gabapentin (NEURONTIN) 600 MG tablet Take 600 mg by mouth 2 (two) times daily.    Historical Provider, MD  Insulin Lispro, Human, (HUMALOG ) Inject into the skin.    Historical Provider, MD  lisinopril (PRINIVIL,ZESTRIL) 40 MG tablet Take 40 mg by mouth daily.    Historical Provider, MD  lovastatin (MEVACOR) 40 MG tablet Take 40 mg by mouth at bedtime.    Historical Provider, MD  ranitidine (ZANTAC) 75 MG tablet Take 75 mg by mouth 2 (two) times daily.    Historical Provider, MD   BP 141/92  Pulse 86  Temp(Src) 98.2 F (36.8 C)  Resp 16  Ht  (1.803 m)  Wt 240 lb (108.863 kg)  BMI 33.49 kg/m2  SpO2 99% Physical Exam  Nursing note and vitals reviewed. Constitutional: He is oriented to person, place, and time. He appears well-developed and well-nourished.  HENT:  Head: Normocephalic and atraumatic.  Eyes: Conjunctivae and EOM are normal. Pupils are equal, round, and reactive to light.  Neck: Normal range of motion.  Cardiovascular: Normal rate and regular rhythm.   Pulmonary/Chest: Effort normal.  Abdominal: He exhibits no distension.  Musculoskeletal: Normal range of motion.  Neurological: He is alert and oriented to person, place,  and time.  Skin: Skin is warm. There is erythema.  1cm round purplish discolored area  (hematoma?)  Psychiatric: He has a normal mood and affect.    ED Course  Procedures (including critical care time) Labs Review Labs Reviewed  CBC WITH DIFFERENTIAL  COMPREHENSIVE METABOLIC PANEL  PROTIME-INR  APTT  TROPONIN I    Imaging Review Dg Chest Port 1 View  08/07/2014   CLINICAL DATA:  Chest pain  EXAM: PORTABLE CHEST - 1 VIEW  COMPARISON:  None.  FINDINGS: Cardiac silhouette is normal in size. Normal mediastinal and hilar contours. Clear lungs. No pleural effusion or pneumothorax.  The bony thorax  is intact.  IMPRESSION: No active disease.   Electronically Signed   By: Amie Portland M.D.   On: 08/07/2014 14:25       Dr. Rosalia Hammers in to see and examine.  Stemi called.   Pt given asa, heparin bolus,  Pt to [facility] to see cardiologist   Final diagnoses:  ST elevation myocardial infarction (STEMI), unspecified artery  Insect bite of left leg, initial encounter     Pt had increased wait to EKg.   Pt did not present with chest problem, came in for insect bite and did not mention chest until provider review of systoms.    Lonia Skinner [place], PA-C 08/07/14 1446

## 2014-08-07 NOTE — Brief Op Note (Addendum)
NAME:  Jonathan Clayton   MRN: [id number] DOB:  August 28, 1966   ADMIT DATE: 08/07/2014  Brief Cardia Catheterization Note:  Indication: 1. Anterolateral STEMI  Procedures: 1. Left Heart Catheterization with Native Coroanry Angiography via 6 French Right Radial Artery access  Unable to engage with TIG 4.0 --> LCA: 78fR XB LAD 3.5 guide, RCA: Williams Right guide; LV hemodynamics TIG 4.0  TR Band: 16 ml @ 1710 hrs 2. PCI to mid circumflex-OM 2 tandem 80% stenoses with intervening 50%-  Promus Premier DES 2.75 mm x 16 mm and 3.0 mm x 24 mm (postdilated to 3 mm distally at the 25 mm proximally) -- reduced to 0% 3. PCI to mid LAD 100% occlusion: 2 overlapping Promus Premier DES  2.25 mm x 20 mm and 2.25 mm 8 mm - postdilated to 2.4 mm - occlusion reduced to 0%, but TIMI 2 flow to apex.  Medications:  2 mg Versed IV; 50 mcg Fentanyl IV  Intra-Coronary nitroglycerin 600 mcg Radial Cocktail: 5 mg Verapamil, 400 mcg NTG, 2 ml 2% Lidocaine in 10 ml NS Angiomax Bolus & Infusion  Impression:  Severe three-vessel disease with 100% occlusion of mid LAD and mid D2 with tandem 80-90% stenoses in mid OM 2.  based on the EKG showing inferolateral ST elevations, it was unclear what the culprit lesion was, therefore decision was made to intervene on the circumflex an attempt the LAD if able to wire.  Left main is relatively free of disease. LAD has early mid 50% stenosis just before the bifurcation in the D2 and LAD. The LAD is occluded in the midsection of her large septal perforator. D2 is occluded also at a acute bend.  The complex mostly courses as a lateral OM 2.  There is a small caliber high OM 1 that courses as a ramus intermedius that is disease in both the small in diameter. There is TIMI 1-2 flow, it is too small for PCI.  Successful PCI of the mid circumflex OM2 with 2 overlapping DES   Successful PCI of mid LAD with suboptimal distal result, potentially due to distal embolization and a  small-caliber vessel  Residual occluded mid D2 which is a moderate caliber, albeit tortuous vessel. There was faint TIMI 1 flow beyond what appeared to be occlusion after wiring the vessel.    Widely patent very large RCA with distal RPDA E. tandem 60% stenoses. There are at least 3 small PL branches with one large major PL branch  Hemodynamics:   Central AoP: 99/69/83 mmHg  LVP/EDP: 103/5/9 mmHg  Recommendations:  Admit to CCU,but likely under stepdown orders. Follow Troponin levels.  DAPT x 1 yr; CM & CRH consult  6 hrs of Aggrestat in attempt to recanalize the apical LAD & D2.  If he has persistent symptoms, would consider staged PCI of D2.  Aspirin plus Brilinta for minimum one year. Restart multiple medications, Carvedilol to 25 mg twice a day, lisinopril reduced to 20 mg daily and hold losartan until his post cath pressures improve.   He is on a statin.   Check 2-D echocardiogram  If BP stable & symptoms do not recur, would consider d/c by Monday AM.  Cannot forget about L Leg spider/bug bite.  Will need to determine if Abx needed or expectant management.   This was not addressed in the acute setting.  Will defer to rounding team over the weekend. Full note to follow    Marykay Lex, M.D., M.S. Interventional Cardiologist   Pager #  [phone] 08/07/2014  5:33 PM

## 2014-08-08 DIAGNOSIS — E785 Hyperlipidemia, unspecified: Secondary | ICD-10-CM | POA: Diagnosis present

## 2014-08-08 DIAGNOSIS — I10 Essential (primary) hypertension: Secondary | ICD-10-CM

## 2014-08-08 DIAGNOSIS — I2510 Atherosclerotic heart disease of native coronary artery without angina pectoris: Secondary | ICD-10-CM

## 2014-08-08 DIAGNOSIS — I219 Acute myocardial infarction, unspecified: Secondary | ICD-10-CM

## 2014-08-08 LAB — CBC
HCT: 35.3 % — ABNORMAL LOW (ref 39.0–52.0)
Hemoglobin: 12.2 g/dL — ABNORMAL LOW (ref 13.0–17.0)
MCH: 30 pg (ref 26.0–34.0)
MCHC: 34.6 g/dL (ref 30.0–36.0)
MCV: 86.9 fL (ref 78.0–100.0)
Platelets: 363 10*3/uL (ref 150–400)
RBC: 4.06 MIL/uL — AB (ref 4.22–5.81)
RDW: 14.1 % (ref 11.5–15.5)
WBC: 11.9 10*3/uL — ABNORMAL HIGH (ref 4.0–10.5)

## 2014-08-08 LAB — GLUCOSE, CAPILLARY
GLUCOSE-CAPILLARY: 213 mg/dL — AB (ref 70–99)
GLUCOSE-CAPILLARY: 219 mg/dL — AB (ref 70–99)
Glucose-Capillary: 197 mg/dL — ABNORMAL HIGH (ref 70–99)
Glucose-Capillary: 257 mg/dL — ABNORMAL HIGH (ref 70–99)
Glucose-Capillary: 269 mg/dL — ABNORMAL HIGH (ref 70–99)

## 2014-08-08 LAB — BASIC METABOLIC PANEL
Anion gap: 11 (ref 5–15)
BUN: 17 mg/dL (ref 6–23)
CO2: 22 mEq/L (ref 19–32)
Calcium: 8.7 mg/dL (ref 8.4–10.5)
Chloride: 101 mEq/L (ref 96–112)
Creatinine, Ser: 1.22 mg/dL (ref 0.50–1.35)
GFR calc Af Amer: 79 mL/min — ABNORMAL LOW (ref >90)
GFR, EST NON AFRICAN AMERICAN: 68 mL/min — AB (ref >90)
Glucose, Bld: 203 mg/dL — ABNORMAL HIGH (ref 70–99)
POTASSIUM: 4 meq/L (ref 3.7–5.3)
SODIUM: 134 meq/L — AB (ref 137–147)

## 2014-08-08 MED ORDER — SULFAMETHOXAZOLE-TMP DS 800-160 MG PO TABS
2.0000 | ORAL_TABLET | Freq: Two times a day (BID) | ORAL | Status: DC
  Administered 2014-08-08 – 2014-08-11 (×7): 2 via ORAL
  Filled 2014-08-08 (×8): qty 2

## 2014-08-08 MED ORDER — LISINOPRIL 40 MG PO TABS
40.0000 mg | ORAL_TABLET | Freq: Every day | ORAL | Status: DC
  Administered 2014-08-08 – 2014-08-09 (×2): 40 mg via ORAL
  Filled 2014-08-08 (×3): qty 1

## 2014-08-08 NOTE — Progress Notes (Addendum)
SUBJECTIVE:  Doing well with no CP  OBJECTIVE:   Vitals:   Filed Vitals:   08/08/14 0400 08/08/14 0500 08/08/14 0600 08/08/14 0700  BP: 159/85 150/69 142/80 145/90  Pulse: 92 89 89 87  Temp: 98.3 F (36.8 C)     TempSrc: Oral     Resp: 20 16 21 21   Height:      Weight:      SpO2: 92% 95% 94% 96%   I&O's:   Intake/Output Summary (Last 24 hours) at 08/08/14 7253 Last data filed at 08/08/14 0300  Gross per 24 hour  Intake 1126.85 ml  Output   1000 ml  Net 126.85 ml   TELEMETRY: Reviewed telemetry pt in NSR:     PHYSICAL EXAM General: Well developed, well nourished, in no acute distress Head: Eyes PERRLA, No xanthomas.   Normal cephalic and atramatic  Lungs:   Clear bilaterally to auscultation and percussion. Heart:   HRRR S1 S2 Pulses are 2+ & equal.            No carotid bruit. No JVD.  No abdominal bruits. No femoral bruits. Abdomen: Bowel sounds are positive, abdomen soft and non-tender without masses  Extremities:   No clubbing, cyanosis or edema.  DP +1 Neuro: Alert and oriented X 3. Psych:  Good affect, responds appropriately   LABS: Basic Metabolic Panel:  Recent Labs  66/44/03 1415 08/07/14 1945 08/08/14 0259  NA 135*  --  134*  K 4.5  --  4.0  CL 97  --  101  CO2 22  --  22  GLUCOSE 294*  --  203*  BUN 19  --  17  CREATININE 1.30 1.02 1.22  CALCIUM 9.7  --  8.7   Liver Function Tests:  Recent Labs  08/07/14 1415  AST 34  ALT 17  ALKPHOS 82  BILITOT 0.5  PROT 7.4  ALBUMIN 2.8*   No results found for this basename: LIPASE, AMYLASE,  in the last 72 hours CBC:  Recent Labs  08/07/14 1415 08/07/14 1945 08/08/14 0259  WBC 11.2* 11.0* 11.9*  NEUTROABS 7.8*  --   --   HGB 13.6 13.2 12.2*  HCT 39.8 38.3* 35.3*  MCV 88.4 87.6 86.9  PLT 323 323 363   Cardiac Enzymes:  Recent Labs  08/07/14 1415  TROPONINI 5.28*   BNP: No components found with this basename: POCBNP,  D-Dimer: No results found for this basename: DDIMER,  in the  last 72 hours Hemoglobin A1C: No results found for this basename: HGBA1C,  in the last 72 hours Fasting Lipid Panel: No results found for this basename: CHOL, HDL, LDLCALC, TRIG, CHOLHDL, LDLDIRECT,  in the last 72 hours Thyroid Function Tests: No results found for this basename: TSH, T4TOTAL, FREET3, T3FREE, THYROIDAB,  in the last 72 hours Anemia Panel: No results found for this basename: VITAMINB12, FOLATE, FERRITIN, TIBC, IRON, RETICCTPCT,  in the last 72 hours Coag Panel:   Lab Results  Component Value Date   INR 1.17 08/07/2014    RADIOLOGY: Dg Chest Port 1 View  08/07/2014   CLINICAL DATA:  Chest pain  EXAM: PORTABLE CHEST - 1 VIEW  COMPARISON:  None.  FINDINGS: Cardiac silhouette is normal in size. Normal mediastinal and hilar contours. Clear lungs. No pleural effusion or pneumothorax.  The bony thorax is intact.  IMPRESSION: No active disease.   Electronically Signed   By: Amie Portland M.D.   On: 08/07/2014 14:25      ASSESSMENT:  1.  Acute anterolateral STEMI with cath showing severe 3 vessel ASCAD with occluded mid LAD and mid D2, 80-90% mid OM2, patent RCA with 60% tandem stenoisis of RPDA - s/p PCI of mid left circ OM2 and PCI to the mid LAD with 2 overlapping DES stents.   2.  DM 3.  HTN - elevated 4.  Dyslipidemia 5.  Spider bite ? Brown recluse - it is spreading and very dark blue in color ? necrosis  PLAN:   1.  Continue ASA/Brilinta/BB/ACE I 2.  2D echo today to assess LVF 3.  Transfer to tele bed 4.  Increase Lisinopril to  daily for better BP control 5.  ID consult for insect bite which is enlarging and very dark in color in a diabetic  Quintella Reichert, MD  08/08/2014  8:08 AM

## 2014-08-08 NOTE — Progress Notes (Signed)
EKG CRITICAL VALUE     12 lead EKG performed.  Critical value noted.  Marcy Salvo, RN notified.   Graylin Sperling C, CCT 08/08/2014 7:38 AM

## 2014-08-08 NOTE — Progress Notes (Cosign Needed)
CARDIAC REHAB PHASE I   PRE:  Rate/Rhythm: 86 sinus rhythm  BP:  Supine:   Sitting: 120/80  Standing:    SaO2: 97% ra  MODE:  Ambulation: 530 ft   POST:  Rate/Rhythem: 88 sinus rhythm  BP:  Supine:   Sitting: 124/60  Standing:    SaO2: 98% ra  1215-1250  Pt ambulated in hallway using rolling walker.  Slow steady gait.  Pt uses cane at home for diabetic neuropathy.  Pt education completed including medication compliance, NTG use, diabetic control, diet and exercise. Pt oriented to CRPII.  At pt request, referral sent to [facility].  Pt verbalized understanding  Jonathan Clayton, [employer]

## 2014-08-08 NOTE — Progress Notes (Signed)
Chaplain Note: Responded to request by RN to provide some hotel information to patient's family needing a place to stay. Spoke with patient's family on the phone and suggested several hotel options. Follow up recommended to see if family may be candidates for [facility]. Rutherford Nail, Chaplain

## 2014-08-08 NOTE — Consult Note (Signed)
[facility]    Date of Admission:  08/07/2014  Date of Consult:  08/08/2014  Reason for Consult: spider bite, skin infection Referring Physician: Dr. Radford Pax  HPI: Jonathan Clayton is an 48 y.o. male with DM, HTN, HLD, prior CVA admitted after 3 days of chest pressure and found to have anterolateral STEMI sp cardiac cath and PCI of circumflex, LAD. In interim he revealed history of skin lesion on leg that he thought was spider bite that has continued to grow in size. There has been no purulent drainage. He has had no prior hx of boils. He has no fevers or chills or malaise. He has a dog but no cats , no scratches near the area, no dermatological or Rheumatological PMHX.   Past Medical History  Diagnosis Date  . Diabetes mellitus without complication   . Diabetic neuropathy   . Stroke   . High cholesterol     History reviewed. No pertinent past surgical history.ergies:   No Known Allergies   Medications: I have reviewed patients current medications as documented in Epic Anti-infectives   Start     Dose/Rate Route Frequency Ordered Stop   08/08/14 1400  sulfamethoxazole-trimethoprim (BACTRIM DS) 800-160 MG per tablet 2 tablet     2 tablet Oral Every 12 hours 08/08/14 1251        Social History:  reports that he has never smoked. He does not have any smokeless tobacco history on file. He reports that he does not drink alcohol or use illicit drugs.  No family history on file.  As in HPI and primary teams notes otherwise 12 point review of systems is negative  Blood pressure 144/86, pulse 86, temperature 98.4 F (36.9 C), temperature source Oral, resp. rate 16, height _0  (1.803 m), weight 230 lb (104.327 kg), SpO2 97.00%. General: Alert and awake, oriented x3, not in any acute distress. HEENT: anicteric sclera, pupils reactive to light and accommodation, EOMI, oropharynx clear and without exudate CVS regular rate, normal r,  no murmur rubs or  gallops Chest: clear to auscultation bilaterally, no wheezing, rales or rhonchi Abdomen: soft nontender, nondistended, normal bowel sounds,  Skin:    Raised violaceous  tender lesion:       Neuro: nonfocal, strength and sensation intact   Results for orders placed during the hospital encounter of 08/07/14 (from the past 48 hour(s))  CBC WITH DIFFERENTIAL     Status: Abnormal   Collection Time    08/07/14  2:15 PM      Result Value Ref Range   WBC 11.2 (*) 4.0 - 10.5 K/uL   RBC 4.50  4.22 - 5.81 MIL/uL   Hemoglobin 13.6  13.0 - 17.0 g/dL   HCT 39.8  39.0 - 52.0 %   MCV 88.4  78.0 - 100.0 fL   MCH 30.2  26.0 - 34.0 pg   MCHC 34.2  30.0 - 36.0 g/dL   RDW 13.9  11.5 - 15.5 %   Platelets 323  150 - 400 K/uL   Neutrophils Relative % 69  43 - 77 %   Neutro Abs 7.8 (*) 1.7 - 7.7 K/uL   Lymphocytes Relative 17  12 - 46 %   Lymphs Abs 1.9  0.7 - 4.0 K/uL   Monocytes Relative 12  3 - 12 %   Monocytes Absolute 1.4 (*) 0.1 - 1.0 K/uL   Eosinophils Relative 1  0 - 5 %   Eosinophils Absolute 0.1  0.0 -  0.7 K/uL   Basophils Relative 1  0 - 1 %   Basophils Absolute 0.1  0.0 - 0.1 K/uL  COMPREHENSIVE METABOLIC PANEL     Status: Abnormal   Collection Time    08/07/14  2:15 PM      Result Value Ref Range   Sodium 135 (*) 137 - 147 mEq/L   Potassium 4.5  3.7 - 5.3 mEq/L   Chloride 97  96 - 112 mEq/L   CO2 22  19 - 32 mEq/L   Glucose, Bld 294 (*) 70 - 99 mg/dL   BUN 19  6 - 23 mg/dL   Creatinine, Ser 1.30  0.50 - 1.35 mg/dL   Calcium 9.7  8.4 - 10.5 mg/dL   Total Protein 7.4  6.0 - 8.3 g/dL   Albumin 2.8 (*) 3.5 - 5.2 g/dL   AST 34  0 - 37 U/L   ALT 17  0 - 53 U/L   Alkaline Phosphatase 82  39 - 117 U/L   Total Bilirubin 0.5  0.3 - 1.2 mg/dL   GFR calc non Af Amer 63 (*) >90 mL/min   GFR calc Af Amer 73 (*) >90 mL/min   Comment: (NOTE)     The eGFR has been calculated using the CKD EPI equation.     This calculation has not been validated in all clinical situations.      eGFR's persistently <90 mL/min signify possible Chronic Kidney     Disease.   Anion gap 16 (*) 5 - 15  PROTIME-INR     Status: None   Collection Time    08/07/14  2:15 PM      Result Value Ref Range   Prothrombin Time 14.9  11.6 - 15.2 seconds   INR 1.17  0.00 - 1.49  APTT     Status: None   Collection Time    08/07/14  2:15 PM      Result Value Ref Range   aPTT 35  24 - 37 seconds  TROPONIN I     Status: Abnormal   Collection Time    08/07/14  2:15 PM      Result Value Ref Range   Troponin I 5.28 (*) <0.30 ng/mL   Comment: CRITICAL RESULT CALLED TO, READ BACK BY AND VERIFIED WITH:     Phyllis Ginger RN _0  08/07/14 OLSONM                Due to the release kinetics of cTnI,     a negative result within the first hours     of the onset of symptoms does not rule out     myocardial infarction with certainty.     If myocardial infarction is still suspected,     repeat the test at appropriate intervals.  POCT ACTIVATED CLOTTING TIME     Status: None   Collection Time    08/07/14  4:17 PM      Result Value Ref Range   Activated Clotting Time 501    MRSA PCR SCREENING     Status: None   Collection Time    08/07/14  5:53 PM      Result Value Ref Range   MRSA by PCR NEGATIVE  NEGATIVE   Comment:            The GeneXpert MRSA Assay (FDA     approved for NASAL specimens     only), is one component of a     comprehensive MRSA  colonization     surveillance program. It is not     intended to diagnose MRSA     infection nor to guide or     monitor treatment for     MRSA infections.  CBC     Status: Abnormal   Collection Time    08/07/14  7:45 PM      Result Value Ref Range   WBC 11.0 (*) 4.0 - 10.5 K/uL   RBC 4.37  4.22 - 5.81 MIL/uL   Hemoglobin 13.2  13.0 - 17.0 g/dL   HCT 38.3 (*) 39.0 - 52.0 %   MCV 87.6  78.0 - 100.0 fL   MCH 30.2  26.0 - 34.0 pg   MCHC 34.5  30.0 - 36.0 g/dL   RDW 14.0  11.5 - 15.5 %   Platelets 323  150 - 400 K/uL  CREATININE, SERUM     Status:  Abnormal   Collection Time    08/07/14  7:45 PM      Result Value Ref Range   Creatinine, Ser 1.02  0.50 - 1.35 mg/dL   GFR calc non Af Amer 85 (*) >90 mL/min   GFR calc Af Amer >90  >90 mL/min   Comment: (NOTE)     The eGFR has been calculated using the CKD EPI equation.     This calculation has not been validated in all clinical situations.     eGFR's persistently <90 mL/min signify possible Chronic Kidney     Disease.  GLUCOSE, CAPILLARY     Status: Abnormal   Collection Time    08/07/14  7:47 PM      Result Value Ref Range   Glucose-Capillary 195 (*) 70 - 99 mg/dL  GLUCOSE, CAPILLARY     Status: Abnormal   Collection Time    08/07/14 11:46 PM      Result Value Ref Range   Glucose-Capillary 206 (*) 70 - 99 mg/dL  BASIC METABOLIC PANEL     Status: Abnormal   Collection Time    08/08/14  2:59 AM      Result Value Ref Range   Sodium 134 (*) 137 - 147 mEq/L   Potassium 4.0  3.7 - 5.3 mEq/L   Chloride 101  96 - 112 mEq/L   CO2 22  19 - 32 mEq/L   Glucose, Bld 203 (*) 70 - 99 mg/dL   BUN 17  6 - 23 mg/dL   Creatinine, Ser 1.22  0.50 - 1.35 mg/dL   Calcium 8.7  8.4 - 10.5 mg/dL   GFR calc non Af Amer 68 (*) >90 mL/min   GFR calc Af Amer 79 (*) >90 mL/min   Comment: (NOTE)     The eGFR has been calculated using the CKD EPI equation.     This calculation has not been validated in all clinical situations.     eGFR's persistently <90 mL/min signify possible Chronic Kidney     Disease.   Anion gap 11  5 - 15  CBC     Status: Abnormal   Collection Time    08/08/14  2:59 AM      Result Value Ref Range   WBC 11.9 (*) 4.0 - 10.5 K/uL   RBC 4.06 (*) 4.22 - 5.81 MIL/uL   Hemoglobin 12.2 (*) 13.0 - 17.0 g/dL   HCT 35.3 (*) 39.0 - 52.0 %   MCV 86.9  78.0 - 100.0 fL   MCH 30.0  26.0 - 34.0 pg  MCHC 34.6  30.0 - 36.0 g/dL   RDW 14.1  11.5 - 15.5 %   Platelets 363  150 - 400 K/uL  GLUCOSE, CAPILLARY     Status: Abnormal   Collection Time    08/08/14  3:59 AM      Result Value  Ref Range   Glucose-Capillary 213 (*) 70 - 99 mg/dL  GLUCOSE, CAPILLARY     Status: Abnormal   Collection Time    08/08/14  8:08 AM      Result Value Ref Range   Glucose-Capillary 197 (*) 70 - 99 mg/dL  GLUCOSE, CAPILLARY     Status: Abnormal   Collection Time    08/08/14 11:10 AM      Result Value Ref Range   Glucose-Capillary 257 (*) 70 - 99 mg/dL   Comment 1 Documented in Chart     Comment 2 Notify RN    GLUCOSE, CAPILLARY     Status: Abnormal   Collection Time    08/08/14  4:52 PM      Result Value Ref Range   Glucose-Capillary 219 (*) 70 - 99 mg/dL   _0 (sdes,specrequest,cult,reptstatus)   ) Recent Results (from the past 720 hour(s))  MRSA PCR SCREENING     Status: None   Collection Time    08/07/14  5:53 PM      Result Value Ref Range Status   MRSA by PCR NEGATIVE  NEGATIVE Final   Comment:            The GeneXpert MRSA Assay (FDA     approved for NASAL specimens     only), is one component of a     comprehensive MRSA colonization     surveillance program. It is not     intended to diagnose MRSA     infection nor to guide or     monitor treatment for     MRSA infections.     Impression/Recommendation  Active Problems:   ST elevation myocardial infarction (STEMI) of inferolateral wall   Benign essential HTN   Dyslipidemia   Jonathan Clayton is a 48 y.o. male with CAD sp CABG, now with AL STEMI and found to have skin lesion.  #1 Skin lesion: Not clear what this is. Sudden onset makes one concerned for MRSA, MSSA infection though appearance is NOT at all typical. An actual brown recluse spider bite is also possible. Finally more unusual pathology including infection with Nocardia could be considered vs vasculitis  I will start oral Bactrim DS two bid  Warm compresses  May benefit from Dermatology consult and or biopsy though the former will NOT be available in house  #2 Screening: check HIV and hep panel.   08/08/2014, 5:44 PM   Thank you so  much for this interesting consult  [facility] [place] [phone] (pager) [phone] (office) 08/08/2014, 5:44 PM  Rhina Br[place] 08/08/2014, 5:44 PM

## 2014-08-09 DIAGNOSIS — I059 Rheumatic mitral valve disease, unspecified: Secondary | ICD-10-CM

## 2014-08-09 DIAGNOSIS — I2119 ST elevation (STEMI) myocardial infarction involving other coronary artery of inferior wall: Secondary | ICD-10-CM

## 2014-08-09 LAB — HEPATITIS PANEL, ACUTE
HCV Ab: NEGATIVE
Hep A IgM: NONREACTIVE
Hep B C IgM: NONREACTIVE
Hepatitis B Surface Ag: NEGATIVE

## 2014-08-09 LAB — BASIC METABOLIC PANEL
ANION GAP: 15 (ref 5–15)
BUN: 17 mg/dL (ref 6–23)
CALCIUM: 8.7 mg/dL (ref 8.4–10.5)
CO2: 19 mEq/L (ref 19–32)
Chloride: 98 mEq/L (ref 96–112)
Creatinine, Ser: 1.32 mg/dL (ref 0.50–1.35)
GFR calc Af Amer: 72 mL/min — ABNORMAL LOW (ref >90)
GFR, EST NON AFRICAN AMERICAN: 62 mL/min — AB (ref >90)
Glucose, Bld: 189 mg/dL — ABNORMAL HIGH (ref 70–99)
POTASSIUM: 4.1 meq/L (ref 3.7–5.3)
SODIUM: 132 meq/L — AB (ref 137–147)

## 2014-08-09 LAB — GLUCOSE, CAPILLARY
GLUCOSE-CAPILLARY: 283 mg/dL — AB (ref 70–99)
GLUCOSE-CAPILLARY: 235 mg/dL — AB (ref 70–99)
GLUCOSE-CAPILLARY: 255 mg/dL — AB (ref 70–99)
Glucose-Capillary: 175 mg/dL — ABNORMAL HIGH (ref 70–99)

## 2014-08-09 LAB — HIV ANTIBODY (ROUTINE TESTING W REFLEX): HIV: NONREACTIVE

## 2014-08-09 MED ORDER — INSULIN ASPART 100 UNIT/ML ~~LOC~~ SOLN: 0.0000 [IU] | Freq: Three times a day (TID) | SUBCUTANEOUS | Status: DC

## 2014-08-09 MED ORDER — INSULIN ASPART 100 UNIT/ML ~~LOC~~ SOLN
0.0000 [IU] | Freq: Three times a day (TID) | SUBCUTANEOUS | Status: DC
  Administered 2014-08-09: 5 [IU] via SUBCUTANEOUS
  Administered 2014-08-09: 3 [IU] via SUBCUTANEOUS
  Administered 2014-08-10: 17:00:00 via SUBCUTANEOUS
  Administered 2014-08-10: 2 [IU] via SUBCUTANEOUS
  Administered 2014-08-10: 7 [IU] via SUBCUTANEOUS
  Administered 2014-08-11: 3 [IU] via SUBCUTANEOUS
  Administered 2014-08-11: 7 [IU] via SUBCUTANEOUS

## 2014-08-09 MED ORDER — HYDROCHLOROTHIAZIDE 25 MG PO TABS
25.0000 mg | ORAL_TABLET | Freq: Every day | ORAL | Status: DC
  Administered 2014-08-09: 25 mg via ORAL
  Filled 2014-08-09 (×2): qty 1

## 2014-08-09 NOTE — Progress Notes (Signed)
UR Completed.  Daulton Harbaugh Jane [phone] 08/09/2014  

## 2014-08-09 NOTE — Progress Notes (Addendum)
SUBJECTIVE:  The patient reports sone PND and orthopnea the last night, now resolved. No CP.   Marland Kitchen aspirin  81 mg Oral Daily  . carvedilol  25 mg Oral BID WC  . famotidine  20 mg Oral BID  . gabapentin  600 mg Oral BID  . heparin  5,000 Units Subcutaneous 3 times per day  . insulin aspart  2-6 Units Subcutaneous 6 times per day  . lisinopril  40 mg Oral Daily  . sodium chloride  3 mL Intravenous Q12H  . sulfamethoxazole-trimethoprim  2 tablet Oral Q12H  . ticagrelor  90 mg Oral BID   OBJECTIVE:    Vitals:   Filed Vitals:   08/08/14 1000 08/08/14 1200 08/08/14 2100 08/09/14 0527  BP: 156/89 144/86 147/95 138/79  Pulse: 89 86 85 83  Temp:  98.4 F (36.9 C) 98.5 F (36.9 C) 98.6 F (37 C)  TempSrc:  Oral    Resp: 19 16 16 16   Height:  5\' 11"  (1.803 m)    Weight:  230 lb (104.327 kg)    SpO2: 94% 97% 95% 96%   I&O's:  No intake or output data in the 24 hours ending 08/09/14 0909  TELEMETRY: Reviewed telemetry pt in NSR:  PHYSICAL EXAM General: Well developed, well nourished, in no acute distress Head: Eyes PERRLA, No xanthomas.   Normal cephalic and atramatic  Lungs:   Minimal rales at the basis. Heart:   HRRR S1 S2 Pulses are 2+ & equal.            No carotid bruit. No JVD.  No abdominal bruits. No femoral bruits. Abdomen: Bowel sounds are positive, abdomen soft and non-tender without masses  Extremities:   No clubbing, cyanosis or edema.  DP +1, dark skin lesion on the left shin Neuro: Alert and oriented X 3. Psych:  Good affect, responds appropriately  LABS: Basic Metabolic Panel:  Recent Labs  88/82/80 0259 08/09/14 0430  NA 134* 132*  K 4.0 4.1  CL 101 98  CO2 22 19  GLUCOSE 203* 189*  BUN 17 17  CREATININE 1.22 1.32  CALCIUM 8.7 8.7   Liver Function Tests:  Recent Labs  08/07/14 1415  AST 34  ALT 17  ALKPHOS 82  BILITOT 0.5  PROT 7.4  ALBUMIN 2.8*    Recent Labs  08/07/14 1415 08/07/14 1945 08/08/14 0259  WBC 11.2* 11.0* 11.9*    NEUTROABS 7.8*  --   --   HGB 13.6 13.2 12.2*  HCT 39.8 38.3* 35.3*  MCV 88.4 87.6 86.9  PLT 323 323 363   Cardiac Enzymes:  Recent Labs  08/07/14 1415  TROPONINI 5.28*   RADIOLOGY: Dg Chest Port 1 View  08/07/2014   CLINICAL DATA:  Chest pain  EXAM: PORTABLE CHEST - 1 VIEW  COMPARISON:  None.  FINDINGS: Cardiac silhouette is normal in size. Normal mediastinal and hilar contours. Clear lungs. No pleural effusion or pneumothorax.  The bony thorax is intact.  IMPRESSION: No active disease.   Electronically Signed   By: Amie Portland M.D.   On: 08/07/2014 14:25    ASSESSMENT:   1.  Acute anterolateral STEMI with cath showing severe 3 vessel CAD with occluded mid LAD and mid D2, 80-90% mid OM2, patent RCA with 60% tandem stenoisis of RPDA - s/p PCI of mid left circ OM2 and PCI to the mid LAD with 2 overlapping DES stents.   2.  DM 3.  HTN - controlled with increased LIsinopril  4.  Dyslipidemia 5.  ? MRSA, MSSA skin infection (left shin)  - started on Bactrim by ID, HIV testing pending 6.  Orthopnea, PND  PLAN:   1.  Continue ASA/Brilinta/BB/ACEI 2.  2D echo today to assess LVF 3.  Add hydrochlorothiazide 25 mg po daily   Lars Masson, MD  08/09/2014  9:09 AM

## 2014-08-09 NOTE — Progress Notes (Signed)
Pt concerned about discharge and not having any resources to buy meds and get help for cardiac condition. Pt and wife would like to have home health assistance but decline any interest in SNF. Pt has recently moved fm [place] area to [place].  Pt states that nobody has discussed discharge with him or his wife. Explained to patient the discharge process and reiterated what ID plan about abcess or insect bite to knee.

## 2014-08-09 NOTE — Progress Notes (Signed)
  Echocardiogram 2D Echocardiogram has been performed.  [place] Co 08/09/2014, 12:14 PM

## 2014-08-09 NOTE — Progress Notes (Signed)
[facility]  Day # 2 bactrim  Subjective: No change in lesion on leg   Antibiotics:  Anti-infectives   Start     Dose/Rate Route Frequency Ordered Stop   08/08/14 1400  sulfamethoxazole-trimethoprim (BACTRIM DS) 800-160 MG per tablet 2 tablet     2 tablet Oral Every 12 hours 08/08/14 1251        Medications: Scheduled Meds: . aspirin  81 mg Oral Daily  . carvedilol  25 mg Oral BID WC  . famotidine  20 mg Oral BID  . gabapentin  600 mg Oral BID  . heparin  5,000 Units Subcutaneous 3 times per day  . hydrochlorothiazide  25 mg Oral Daily  . insulin aspart  2-6 Units Subcutaneous 6 times per day  . lisinopril  40 mg Oral Daily  . sodium chloride  3 mL Intravenous Q12H  . sulfamethoxazole-trimethoprim  2 tablet Oral Q12H  . ticagrelor  90 mg Oral BID   Continuous Infusions:  PRN Meds:.sodium chloride, acetaminophen, hydrALAZINE, morphine injection, ondansetron (ZOFRAN) IV, sodium chloride    Objective: Weight change: -10 lb (-4.536 kg)  Intake/Output Summary (Last 24 hours) at 08/09/14 1158 Last data filed at 08/09/14 1034  Gross per 24 hour  Intake      3 ml  Output      0 ml  Net      3 ml   Blood pressure 134/82, pulse 83, temperature 98.6 F (37 C), temperature source Oral, resp. rate 16, height  (1.803 m), weight 230 lb (104.327 kg), SpO2 96.00%. Temp:  [98.4 F (36.9 C)-98.6 F (37 C)] 98.6 F (37 C) (08/30 0527) Pulse Rate:  [83-86] 83 (08/30 0527) Resp:  [16] 16 (08/30 0527) BP: (134-147)/(79-95) 134/82 mmHg (08/30 1035) SpO2:  [95 %-97 %] 96 % (08/30 0527) Weight:  [230 lb (104.327 kg)] 230 lb (104.327 kg) (08/29 1200)  Physical Exam: General: Alert and awake, oriented x3, not in any acute distress. HEENT:  EOMI Skin:   Skin lesion is not appreciably changed, see sequential pictures, tender esp in the middle of lesion  08/08/14    08/09/14:       Neuro: nonfocal  CBC:  Recent Labs Lab  08/07/14 1415 08/07/14 1945 08/08/14 0259  HGB 13.6 13.2 12.2*  HCT 39.8 38.3* 35.3*  PLT 323 323 363  INR 1.17  --   --   APTT 35  --   --      BMET  Recent Labs  08/08/14 0259 08/09/14 0430  NA 134* 132*  K 4.0 4.1  CL 101 98  CO2 22 19  GLUCOSE 203* 189*  BUN 17 17  CREATININE 1.22 1.32  CALCIUM 8.7 8.7     Liver Panel   Recent Labs  08/07/14 1415  PROT 7.4  ALBUMIN 2.8*  AST 34  ALT 17  ALKPHOS 82  BILITOT 0.5       Sedimentation Rate No results found for this basename: ESRSEDRATE,  in the last 72 hours C-Reactive Protein No results found for this basename: CRP,  in the last 72 hours  Micro Results: Recent Results (from the past 720 hour(s))  MRSA PCR SCREENING     Status: None   Collection Time    08/07/14  5:53 PM      Result Value Ref Range Status   MRSA by PCR NEGATIVE  NEGATIVE Final   Comment:  The GeneXpert MRSA Assay (FDA     approved for NASAL specimens     only), is one component of a     comprehensive MRSA colonization     surveillance program. It is not     intended to diagnose MRSA     infection nor to guide or     monitor treatment for     MRSA infections.    Studies/Results: Dg Chest Port 1 View  08/07/2014   CLINICAL DATA:  Chest pain  EXAM: PORTABLE CHEST - 1 VIEW  COMPARISON:  None.  FINDINGS: Cardiac silhouette is normal in size. Normal mediastinal and hilar contours. Clear lungs. No pleural effusion or pneumothorax.  The bony thorax is intact.  IMPRESSION: No active disease.   Electronically Signed   By: Amie Portland M.D.   On: 08/07/2014 14:25      Assessment/Plan:  Active Problems:   ST elevation myocardial infarction (STEMI) of inferolateral wall   Benign essential HTN   Dyslipidemia    Jonathan Clayton is a 48 y.o. male with  Admission for MI sp cardiac cath and PCI's also with skin lesion on leg, sp bactrim being started yesterday by myself.  #1 Skin lesion: Not clear to  Me that this is  infected. It could INDEED be a brown recluse spider bite in which case the therapy is supportive and requires patience  Some experts use dapsone for spider bites. I started with bactrim DS for better potential strep coverage and also activity vs Nocardia  I would recommend giving him 10 days of TWO DS BID   If he has worsening of the lesion could consider consulting general surgery for biopsy  He also could be seen by Dermatology as an outpatient for consideration of biopsy  With actual brown recluse spider bites surgery can sometimes make the lesions worse  I WOULD INQUIRE INTO HIS TETANUS HX AND IF NO TETANUS VACCINE IN LAST 5 YEARS GIVE TETANUS VACCINE  Dr. Luciana Axe will be here tomorrow.     LOS: 2 days   [name] 08/09/2014, 11:58 AM

## 2014-08-10 DIAGNOSIS — R21 Rash and other nonspecific skin eruption: Secondary | ICD-10-CM

## 2014-08-10 LAB — GLUCOSE, CAPILLARY
GLUCOSE-CAPILLARY: 195 mg/dL — AB (ref 70–99)
GLUCOSE-CAPILLARY: 346 mg/dL — AB (ref 70–99)
Glucose-Capillary: 193 mg/dL — ABNORMAL HIGH (ref 70–99)
Glucose-Capillary: 254 mg/dL — ABNORMAL HIGH (ref 70–99)

## 2014-08-10 MED ORDER — ATORVASTATIN CALCIUM 40 MG PO TABS
40.0000 mg | ORAL_TABLET | Freq: Every day | ORAL | Status: DC
  Administered 2014-08-10: 40 mg via ORAL
  Filled 2014-08-10 (×2): qty 1

## 2014-08-10 MED ORDER — HYDROCHLOROTHIAZIDE 12.5 MG PO CAPS
12.5000 mg | ORAL_CAPSULE | Freq: Every day | ORAL | Status: DC
  Administered 2014-08-11: 12.5 mg via ORAL
  Filled 2014-08-10: qty 1

## 2014-08-10 MED ORDER — LISINOPRIL 20 MG PO TABS
20.0000 mg | ORAL_TABLET | Freq: Every day | ORAL | Status: DC
  Filled 2014-08-10: qty 1

## 2014-08-10 MED FILL — Bivalirudin Trifluoroacetate For IV Soln 250 MG (Base Equiv): INTRAVENOUS | Qty: 250 | Status: AC

## 2014-08-10 MED FILL — Verapamil HCl IV Soln 2.5 MG/ML: INTRAVENOUS | Qty: 2 | Status: AC

## 2014-08-10 MED FILL — Lidocaine HCl Local Preservative Free (PF) Inj 1%: INTRAMUSCULAR | Qty: 30 | Status: AC

## 2014-08-10 MED FILL — Nitroglycerin IV Soln 200 MCG/ML in D5W: INTRAVENOUS | Qty: 1 | Status: AC

## 2014-08-10 MED FILL — Sodium Chloride IV Soln 0.9%: INTRAVENOUS | Qty: 50 | Status: AC

## 2014-08-10 MED FILL — Fentanyl Citrate Inj 0.05 MG/ML: INTRAMUSCULAR | Qty: 2 | Status: AC

## 2014-08-10 MED FILL — Midazolam HCl Inj 2 MG/2ML (Base Equivalent): INTRAMUSCULAR | Qty: 2 | Status: AC

## 2014-08-10 MED FILL — Heparin Sodium (Porcine) 2 Unit/ML in Sodium Chloride 0.9%: INTRAMUSCULAR | Qty: 2000 | Status: AC

## 2014-08-10 MED FILL — Ticagrelor Tab 90 MG: ORAL | Qty: 1 | Status: AC

## 2014-08-10 NOTE — Progress Notes (Signed)
    [facility]  Date of Admission:  08/07/2014  Antibiotics: bactrim  Subjective: Wants to go home  Objective: Temp:  [98.1 F (36.7 C)-99.6 F (37.6 C)] 98.5 F (36.9 C) (08/31 0513) Pulse Rate:  [79-82] 79 (08/31 0513) Resp:  [18] 18 (08/31 0513) BP: (94-140)/(55-86) 94/55 mmHg (08/31 1240) SpO2:  [94 %-96 %] 96 % (08/31 0513) Weight:  [229 lb 4.5 oz (104 kg)] 229 lb 4.5 oz (104 kg) (08/31 0513)  General: awake, alert nad Skin: rash/nodule appears unchanged compared to pictures Lungs: CTA B  Lab Results Lab Results  Component Value Date   WBC 11.9* 08/08/2014   HGB 12.2* 08/08/2014   HCT 35.3* 08/08/2014   MCV 86.9 08/08/2014   PLT 363 08/08/2014    Lab Results  Component Value Date   CREATININE 1.32 08/09/2014   BUN 17 08/09/2014   NA 132* 08/09/2014   K 4.1 08/09/2014   CL 98 08/09/2014   CO2 19 08/09/2014    Lab Results  Component Value Date   ALT 17 08/07/2014   AST 34 08/07/2014   ALKPHOS 82 08/07/2014   BILITOT 0.5 08/07/2014      Microbiology: Recent Results (from the past 240 hour(s))  MRSA PCR SCREENING     Status: None   Collection Time    08/07/14  5:53 PM      Result Value Ref Range Status   MRSA by PCR NEGATIVE  NEGATIVE Final   Comment:            The GeneXpert MRSA Assay (FDA     approved for NASAL specimens     only), is one component of a     comprehensive MRSA colonization     surveillance program. It is not     intended to diagnose MRSA     infection nor to guide or     monitor treatment for     MRSA infections.    Studies/Results: No results found.  Assessment/Plan: !)  RASH unclear etiology  May consider biopsy as outpatient if no improvement in three to five days Bactrim for 3 days  I will sign off, thanks  Staci Righter, MD [facility] [email or web address] [phone] pager   [phone] cell 08/10/2014, 1:49 PM

## 2014-08-10 NOTE — Progress Notes (Signed)
Patient Profile: 48 y/o male with PMH significant for IDDM, HTN, HLD and prior CVA admitted 08/07/14 for inferolateral STEMI. Emergent cardiac catheterization revealed severe 3VD with 100% occlusion of the mid LAD and mid D2 with tandem 80-90% stenosis in the mid OM2. S/p successful 2 vessel PCI to the LAD and OM2. EF 40-45%.   Subjective: Denies CP. Still has mild DOE. Orthopnea has improved since start of diuretic. Denies dizziness, lightheadedness, syncope/ near syncope. His main complaint is fatigue.   Objective: Vital signs in last 24 hours: Temp:  [98.1 F (36.7 C)-99.6 F (37.6 C)] 98.5 F (36.9 C) (08/31 0513) Pulse Rate:  [79-82] 79 (08/31 0513) Resp:  [18] 18 (08/31 0513) BP: (121-140)/(71-86) 138/78 mmHg (08/31 0513) SpO2:  [94 %-96 %] 96 % (08/31 0513) Weight:  [229 lb 4.5 oz (104 kg)] 229 lb 4.5 oz (104 kg) (08/31 0513) Last BM Date: 08/06/14  Intake/Output from previous day: 08/30 0701 - 08/31 0700 In: 3 [I.V.:3] Out: -  Intake/Output this shift:    Medications Current Facility-Administered Medications  Medication Dose Route Frequency Provider Last Rate Last Dose  . 0.9 %  sodium chloride infusion  250 mL Intravenous PRN Marykay Lex, MD      . acetaminophen (TYLENOL) tablet 650 mg  650 mg Oral Q4H PRN Marykay Lex, MD   650 mg at 08/08/14 0315  . aspirin chewable tablet 81 mg  81 mg Oral Daily Marykay Lex, MD   81 mg at 08/09/14 1034  . carvedilol (COREG) tablet 25 mg  25 mg Oral BID WC Marykay Lex, MD   25 mg at 08/10/14 0756  . famotidine (PEPCID) tablet 20 mg  20 mg Oral BID Marykay Lex, MD   20 mg at 08/09/14 2151  . gabapentin (NEURONTIN) tablet 600 mg  600 mg Oral BID Marykay Lex, MD   600 mg at 08/09/14 2151  . heparin injection 5,000 Units  5,000 Units Subcutaneous 3 times per day Marykay Lex, MD   5,000 Units at 08/10/14 0617  . hydrALAZINE (APRESOLINE) injection 10 mg  10 mg Intravenous Q4H PRN Judene Companion, MD   10 mg at  08/07/14 2129  . hydrochlorothiazide (HYDRODIURIL) tablet 25 mg  25 mg Oral Daily Lars Masson, MD   25 mg at 08/09/14 1041  . insulin aspart (novoLOG) injection 0-9 Units  0-9 Units Subcutaneous TID WC Dayna N Dunn, PA-C   2 Units at 08/10/14 0756  . lisinopril (PRINIVIL,ZESTRIL) tablet 40 mg  40 mg Oral Daily Quintella Reichert, MD   40 mg at 08/09/14 1035  . morphine 2 MG/ML injection 2 mg  2 mg Intravenous Q1H PRN Marykay Lex, MD   2 mg at 08/07/14 2343  . ondansetron (ZOFRAN) injection 4 mg  4 mg Intravenous Q6H PRN Marykay Lex, MD   4 mg at 08/07/14 2320  . sodium chloride 0.9 % injection 3 mL  3 mL Intravenous Q12H Marykay Lex, MD   3 mL at 08/09/14 2152  . sodium chloride 0.9 % injection 3 mL  3 mL Intravenous PRN Marykay Lex, MD      . sulfamethoxazole-trimethoprim (BACTRIM DS) 800-160 MG per tablet 2 tablet  2 tablet Oral Q12H Randall Hiss, MD   2 tablet at 08/09/14 2151  . ticagrelor (BRILINTA) tablet 90 mg  90 mg Oral BID Marykay Lex, MD   90 mg at 08/09/14 2151  PE: General appearance: alert, cooperative and no distress Neck: no carotid bruit and no JVD Lungs: clear to auscultation bilaterally Heart: regular rate and rhythm Extremities: no LEE Pulses: 2+ and symmetric Skin: warm and dry Neurologic: Grossly normal  Lab Results:   Recent Labs  08/07/14 1415 08/07/14 1945 08/08/14 0259  WBC 11.2* 11.0* 11.9*  HGB 13.6 13.2 12.2*  HCT 39.8 38.3* 35.3*  PLT 323 323 363   BMET  Recent Labs  08/07/14 1415 08/07/14 1945 08/08/14 0259 08/09/14 0430  NA 135*  --  134* 132*  K 4.5  --  4.0 4.1  CL 97  --  101 98  CO2 22  --  22 19  GLUCOSE 294*  --  203* 189*  BUN 19  --  17 17  CREATININE 1.30 1.02 1.22 1.32  CALCIUM 9.7  --  8.7 8.7   PT/INR  Recent Labs  08/07/14 1415  LABPROT 14.9  INR 1.17   Lipid Panel  No results found for this basename: chol, trig, hdl, cholhdl, vldl, ldlcalc   Cardiac Panel (last 3  results)  Recent Labs  08/07/14 1415  TROPONINI 5.28*    Studies/Results:  2D echo 08/09/14 Study Conclusions  - Procedure narrative: Transthoracic echocardiography. Image quality was suboptimal, due to poor sound transmission. - Left ventricle: The cavity size was normal. Wall thickness was increased in a pattern of moderate LVH. Systolic function was mildly to moderately reduced. The estimated ejection fraction was approximately 40%. Features are consistent with a pseudonormal left ventricular filling pattern, with concomitant abnormal relaxation and increased filling pressure (grade 2 diastolic dysfunction). Doppler parameters are consistent with high ventricular filling pressure. - Regional wall motion abnormality: Akinesis of the apical myocardium; moderate hypokinesis of the apical inferior, mid inferolateral, mid anterolateral, apical septal, and apical lateral myocardium; mild hypokinesis of the apical anterior myocardium. - Mitral valve: There was mild regurgitation. - Systemic veins: IVC dilated with normal respiratory variation. Estimated CVP 8 mmHg.   Assessment/Plan  Active Problems:   ST elevation myocardial infarction (STEMI) of inferolateral wall   Benign essential HTN   Dyslipidemia  1. STEMI/ CAD: s/p PCI to the mid circumflex OM2 with 2 overlapping DES and PCI to the LAD also with 2 overlapping DES. Residual occluded mid D2 which is a moderate caliber, albeit tortuous vessel. There was faint TIMI 1 flow beyond what appeared to be occlusion after wiring the vessel. Also with residual 60% distal RPDA. Denies recurrent CP. Continue DAPT with ASA + Brilinta for a minimum of 1 year. Continue BB and ACE.   2. Mild Combined Systolic + Diastolic Exacerbation: EF 40-45%. Also with grade 2 diastolic dysfunction by echo. He had mild orthopnea and DOE. Symptoms improved with initiation of HCTZ. Stress importance of low sodium diet as an OP.   3. HTN: Per CR RN,  patient was noted to be hypotensive during ambulation this am with SBP in the 80s. Complains of fatigue but no dizziness, syncope/near syncope. He is on high dose Coreg 25 mg BID, Lisinopril 40 mg and HCTZ 25 mg was added yesterday. His HR is in the upper 70s (Coreg was given earlier this am). Hold Lisinopril and HCTZ for now. Would recommended decreasing lisinopril and or HCTZ as an OP. Recommend continuing Coreg at his current dose, given HR.    4. ?DLD: Mentioned in his problem list. No lipid profile in medical record. He is not currently on statin therapy. Hepatic function is normal. He has already  eaten this am.  Recommend outpatient fasting lipid panel for risk assessment.   5. DM: continue Insulin therapy.   Dispo: will continue to monitor BP. Potential discharge home today if BP remains stable.    LOS: 3 days    Brittainy M. Delmer Islam 08/10/2014 9:20 AM   Patient seen and examined. Agree with assessment and plan. Pt developed hypotension to 85 systolic with walking. Will decrease lisinopril to 20 mg and decrease HCTZ to 12.5 mg. Add lipitor 40 mg in place of home dose lovastatin. Needs lipid pane. Will keep today and aim for dc tomorrow.   Lennette Bihari, MD, [facility] 08/10/2014 10:51 AM

## 2014-08-10 NOTE — Care Management Note (Signed)
    Page 1 of 1   08/10/2014     12:06:09 PM CARE MANAGEMENT NOTE 08/10/2014  Patient:  DWRIGHT, HABEL   Account Number:  [id number]  Date Initiated:  08/10/2014  Documentation initiated by:  GRAVES-BIGELOW,Shawn Dannenberg  Subjective/Objective Assessment:   Pt admitted for stemi.     Action/Plan:   Brilinta co pay and 30 day free card given to pt. Pt without PCP and CM did provide pt with [facility] Number for pt to call for PCP.   Anticipated DC Date:  08/10/2014   Anticipated DC Plan:  HOME/SELF CARE      DC Planning Services  CM consult      Choice offered to / List presented to:             Status of service:  Completed, signed off Medicare Important Message given?  NO (If response is "NO", the following Medicare IM given date fields will be blank) Date Medicare IM given:   Medicare IM given by:   Date Additional Medicare IM given:   Additional Medicare IM given by:    Discharge Disposition:  HOME/SELF CARE  Per UR Regulation:  Reviewed for med. necessity/level of care/duration of stay  If discussed at Long Length of Stay Meetings, dates discussed:    Comments:

## 2014-08-10 NOTE — Progress Notes (Signed)
CARDIAC REHAB PHASE I   PRE:  Rate/Rhythm: 84 SR    BP: sitting 109/70 (EOB)    SaO2:   MODE:  Ambulation: 450 ft   POST:  Rate/Rhythm: 87 SR    BP: sitting 85/53, recheck 5 min later 81/53, 15 min later after standing 85/50     SaO2: 99 RA  Pt fatigued with walking. Rest x1. Pt stated that 450 ft seemed really long although he walked farther on Saturday. C/o SOB walking. BP significantly lower after walk and stayed in 80s. Pt denied dizziness but continued to c/o significant fatigue. Pt also began coughing sitting in recliner, sts he had had this since MI, esp with lying flat. Sts it goes away when he stands. Sts he feels like his diuretic is "wearing off".   Reinforced/reviewed ed with pt and wife. Pt seems overwhelmed. Reiterated need for Brilinta and paying attention to warning signs and carrying Ntg. Pt is interested in [facility] in [facility]. [id number] Harriet Masson CES, ACSM 08/10/2014 9:26 AM

## 2014-08-11 DIAGNOSIS — L989 Disorder of the skin and subcutaneous tissue, unspecified: Secondary | ICD-10-CM

## 2014-08-11 DIAGNOSIS — E1159 Type 2 diabetes mellitus with other circulatory complications: Secondary | ICD-10-CM

## 2014-08-11 LAB — LIPID PANEL
Cholesterol: 144 mg/dL (ref 0–200)
HDL: 27 mg/dL — ABNORMAL LOW (ref >39)
LDL CALC: 82 mg/dL (ref 0–99)
TRIGLYCERIDES: 176 mg/dL — AB (ref <150)
Total CHOL/HDL Ratio: 5.3 RATIO
VLDL: 35 mg/dL (ref 0–40)

## 2014-08-11 LAB — GLUCOSE, CAPILLARY
Glucose-Capillary: 233 mg/dL — ABNORMAL HIGH (ref 70–99)
Glucose-Capillary: 307 mg/dL — ABNORMAL HIGH (ref 70–99)

## 2014-08-11 MED ORDER — TICAGRELOR 90 MG PO TABS: 90.0000 mg | ORAL_TABLET | Freq: Two times a day (BID) | ORAL | Status: DC

## 2014-08-11 MED ORDER — NITROGLYCERIN 0.4 MG SL SUBL: 0.4000 mg | SUBLINGUAL_TABLET | SUBLINGUAL | Status: AC | PRN

## 2014-08-11 MED ORDER — LISINOPRIL 20 MG PO TABS: 20.0000 mg | ORAL_TABLET | Freq: Every day | ORAL | Status: DC

## 2014-08-11 MED ORDER — HYDROCHLOROTHIAZIDE 12.5 MG PO CAPS: 12.5000 mg | ORAL_CAPSULE | Freq: Every day | ORAL | Status: DC

## 2014-08-11 MED ORDER — ASPIRIN 81 MG PO CHEW: 81.0000 mg | CHEWABLE_TABLET | Freq: Every day | ORAL | Status: AC

## 2014-08-11 MED ORDER — ATORVASTATIN CALCIUM 40 MG PO TABS: 40.0000 mg | ORAL_TABLET | Freq: Every day | ORAL | Status: DC

## 2014-08-11 MED ORDER — SULFAMETHOXAZOLE-TMP DS 800-160 MG PO TABS: 2.0000 | ORAL_TABLET | Freq: Two times a day (BID) | ORAL | Status: DC

## 2014-08-11 NOTE — Progress Notes (Signed)
Patient Profile: 48 y/o male with PMH significant for IDDM, HTN, HLD and prior CVA admitted 08/07/14 for inferolateral STEMI. Emergent cardiac catheterization revealed severe 3VD with 100% occlusion of the mid LAD and mid D2 with tandem 80-90% stenosis in the mid OM2. S/p successful 2 vessel PCI to the LAD and OM2. EF 40-45%.   Subjective: Denies chest pain and dyspnea. No difficulties ambulating. Denies dizziness, syncope/ near syncope.  Objective: Vital signs in last 24 hours: Temp:  [98.4 F (36.9 C)-98.9 F (37.2 C)] 98.5 F (36.9 C) (09/01 0441) Pulse Rate:  [67-75] 67 (09/01 0441) Resp:  [18] 18 (09/01 0441) BP: (94-143)/(55-95) 127/82 mmHg (09/01 0441) SpO2:  [95 %-97 %] 95 % (09/01 0441) Weight:  [229 lb 4.5 oz (104 kg)] 229 lb 4.5 oz (104 kg) (09/01 0441) Last BM Date: 08/06/14  Intake/Output from previous day:   Intake/Output this shift:    Medications Current Facility-Administered Medications  Medication Dose Route Frequency Provider Last Rate Last Dose  . 0.9 %  sodium chloride infusion  250 mL Intravenous PRN Marykay Lex, MD      . acetaminophen (TYLENOL) tablet 650 mg  650 mg Oral Q4H PRN Marykay Lex, MD   650 mg at 08/10/14 2206  . aspirin chewable tablet 81 mg  81 mg Oral Daily Marykay Lex, MD   81 mg at 08/10/14 1424  . atorvastatin (LIPITOR) tablet 40 mg  40 mg Oral q1800 Brittainy Simmons, PA-C   40 mg at 08/10/14 1707  . carvedilol (COREG) tablet 25 mg  25 mg Oral BID WC Marykay Lex, MD   25 mg at 08/10/14 1708  . famotidine (PEPCID) tablet 20 mg  20 mg Oral BID Marykay Lex, MD   20 mg at 08/10/14 2159  . gabapentin (NEURONTIN) tablet 600 mg  600 mg Oral BID Marykay Lex, MD   600 mg at 08/10/14 2159  . heparin injection 5,000 Units  5,000 Units Subcutaneous 3 times per day Marykay Lex, MD   5,000 Units at 08/10/14 2158  . hydrALAZINE (APRESOLINE) injection 10 mg  10 mg Intravenous Q4H PRN Judene Companion, MD   10 mg at 08/07/14 2129   . hydrochlorothiazide (MICROZIDE) capsule 12.5 mg  12.5 mg Oral Daily Brittainy Simmons, PA-C      . insulin aspart (novoLOG) injection 0-9 Units  0-9 Units Subcutaneous TID WC Dayna N Dunn, PA-C      . lisinopril (PRINIVIL,ZESTRIL) tablet 20 mg  20 mg Oral Daily Brittainy Simmons, PA-C      . morphine 2 MG/ML injection 2 mg  2 mg Intravenous Q1H PRN Marykay Lex, MD   2 mg at 08/07/14 2343  . ondansetron (ZOFRAN) injection 4 mg  4 mg Intravenous Q6H PRN Marykay Lex, MD   4 mg at 08/07/14 2320  . sodium chloride 0.9 % injection 3 mL  3 mL Intravenous Q12H Marykay Lex, MD   3 mL at 08/10/14 2200  . sodium chloride 0.9 % injection 3 mL  3 mL Intravenous PRN Marykay Lex, MD      . sulfamethoxazole-trimethoprim (BACTRIM DS) 800-160 MG per tablet 2 tablet  2 tablet Oral Q12H Randall Hiss, MD   2 tablet at 08/10/14 2159  . ticagrelor (BRILINTA) tablet 90 mg  90 mg Oral BID Marykay Lex, MD   90 mg at 08/11/14 0303    PE: General appearance: alert, cooperative and no distress Neck: no  carotid bruit and no JVD Lungs: clear to auscultation bilaterally Heart: regular rate and rhythm Extremities: no LEE Pulses: 2+ and symmetric Skin: warm and dry Neurologic: Grossly normal  Lab Results:  No results found for this basename: WBC, HGB, HCT, PLT,  in the last 72 hours BMET  Recent Labs  08/09/14 0430  NA 132*  K 4.1  CL 98  CO2 19  GLUCOSE 189*  BUN 17  CREATININE 1.32  CALCIUM 8.7   PT/INR No results found for this basename: LABPROT, INR,  in the last 72 hours Lipid Panel     Component Value Date/Time   CHOL 144 08/11/2014 0428   TRIG 176* 08/11/2014 0428   HDL 27* 08/11/2014 0428   CHOLHDL 5.3 08/11/2014 0428   VLDL 35 08/11/2014 0428   LDLCALC 82 08/11/2014 0428     Cardiac Panel (last 3 results) No results found for this basename: CKTOTAL, CKMB, TROPONINI, RELINDX,  in the last 72 hours  Studies/Results:  2D echo 08/09/14 Study Conclusions  - Procedure  narrative: Transthoracic echocardiography. Image quality was suboptimal, due to poor sound transmission. - Left ventricle: The cavity size was normal. Wall thickness was increased in a pattern of moderate LVH. Systolic function was mildly to moderately reduced. The estimated ejection fraction was approximately 40%. Features are consistent with a pseudonormal left ventricular filling pattern, with concomitant abnormal relaxation and increased filling pressure (grade 2 diastolic dysfunction). Doppler parameters are consistent with high ventricular filling pressure. - Regional wall motion abnormality: Akinesis of the apical myocardium; moderate hypokinesis of the apical inferior, mid inferolateral, mid anterolateral, apical septal, and apical lateral myocardium; mild hypokinesis of the apical anterior myocardium. - Mitral valve: There was mild regurgitation. - Systemic veins: IVC dilated with normal respiratory variation. Estimated CVP 8 mmHg.    Assessment/Plan  Active Problems:   ST elevation myocardial infarction (STEMI) of inferolateral wall   Benign essential HTN   Dyslipidemia  1. STEMI/ CAD: s/p PCI to the mid circumflex OM2 with 2 overlapping DES and PCI to the LAD also with 2 overlapping DES. Residual occluded mid D2 which is a moderate caliber, albeit tortuous vessel. There was faint TIMI 1 flow beyond what appeared to be occlusion after wiring the vessel. Also with residual 60% distal RPDA. Telemetry demonstrates ST elevations, however this was also seen on his post PCI EKGs and likely represent evolutionary change. He denies recurrent CP. Continue DAPT with ASA + Brilinta for a minimum of 1 year. Continue BB and ACE.   2. Mild Combined Systolic + Diastolic Exacerbation: EF 40-45%. Also with grade 2 diastolic dysfunction by echo. He had mild orthopnea and DOE several days ago. Symptoms improved with initiation of HCTZ. Stress importance of low sodium diet as an OP. Continue BB  and ACE therapy.   3. HTN: Controlled and stable after medication adjustment. Continue Coreg 25 mg BID, Lisinopril 20 mg and HCTZ 12.5 mg. Will have patient ambulate with CR prior to discharge to ensure he has no further hypotension with ambulation.   4. DLD: LDL not at goal. LDL calculated at 82. Goal in the setting of CAD/ recent MI is <70. Triglycerides also elevated at 176. Continue Lipitor 40 mg. Recheck fasting lipid and hepatic function panel as an OP.   5. DM: continue Insulin therapy.   Dispo: Plan for likely d/c home today.    LOS: 4 days    Brittainy M. Delmer Islam 08/11/2014 7:47 AM   Patient seen and examined. Agree with  assessment and plan. No recurrent chest pain or dizziness with walking. No further hypotension.  Will DC today. Consider dermatology f/u of left leg skin lesion.   Lennette Bihari, MD, [facility] 08/11/2014 9:42 AM

## 2014-08-11 NOTE — Progress Notes (Signed)
CARDIAC REHAB PHASE I   PRE:  Rate/Rhythm: 75 SR  BP:  Supine:   Sitting: 108/80  Standing:    SaO2:   MODE:  Ambulation: 450 ft   POST:  Rate/Rhythm: 79 SR  BP:  Supine:   Sitting: 118/80  Standing:    SaO2:  1011-1035 Pt walked 450 ft with his cane with steady gait. Had just walked but I wanted to check his BP with activity. No dizziness and tolerated well. No CP.   Luetta Nutting, RN BSN  08/11/2014 10:31 AM

## 2014-08-11 NOTE — Progress Notes (Signed)
Inpatient Diabetes Program Recommendations  AACE/ADA: New Consensus Statement on Inpatient Glycemic Control (2013)  Target Ranges:  Prepandial:   less than 140 mg/dL      Peak postprandial:   less than 180 mg/dL (1-2 hours)      Critically ill patients:  140 - 180 mg/dL  Results for Jonathan Clayton, Jonathan Clayton (MRN [id number]) as of 08/11/2014 12:18  Ref. Range 08/10/2014 11:53 08/10/2014 16:30 08/10/2014 20:49 08/11/2014 07:36 08/11/2014 11:37  Glucose-Capillary Latest Range: 70-99 mg/dL 165 (H) 537 (H) 482 (H) 233 (H) 307 (H)   Inpatient Diabetes Program Recommendations Insulin - Basal: Start patient's home dose of 70/30 Thank you  Piedad Climes BSN, RN,CDE Inpatient Diabetes Coordinator [phone] (team pager)

## 2014-08-11 NOTE — Discharge Summary (Signed)
Physician Discharge Summary  Patient ID: Jonathan Clayton MRN: [id number] DOB/AGE: Nov 19, 1966 48 y.o.  Admit date: 08/07/2014 Discharge date: 08/11/2014  Admission Diagnoses: STEMI   Discharge Diagnoses:  Active Problems:   ST elevation myocardial infarction (STEMI) of inferolateral wall   Benign essential HTN   Dyslipidemia   Discharged Condition: stable  Hospital Course: The patient is a 48 y/o male with no prior cardiac history but with PMH significant for IDDM, HTN, HLD and prior CVA admitted 08/07/14 for inferolateral STEMI. Emergent cardiac catheterization, performed by Dr. Herbie Baltimore, revealed severe 3VD with 100% occlusion of the mid LAD and mid D2 with tandem 80-90% stenosis in the mid OM2. S/p successful 2 vessel PCI to the LAD and OM2. EF 40-45%. He was placed on DAPT with ASA and Brilinta and had no recurrent CP. He was also continued on BB, ACE-I and statin therapy.   Post PCI, he complained on mild DOE and orthopnea. Given his reduced EF, he was placed on a thiazide diuretic. He was initially placed on 25 mg of HCTZ. His dyspnea resolved, however he developed subsequent hypotension with SBPs in the mid 80s during ambulation. As a result, his HCTZ was reduced to 12.5 mg and his lisinopril was reduced from 40 to 20 mg. He was continue on 25 mg of Coreg BID. His BP improved and stabilized. He had no further hypotension. His HR remained stable. His right radial access site also remained stable and free from complications. Renal function was stable.   Also of note, Infectious Disease was consulted to assess a potential spider bite vs skin infection. He had a lesion on his lower extremity. The etiology was unclear but there was concern for potential brown recluse spider bite. ID recommended treatment with bactrim BID x 3 days. He was prescribed antibiotics to complete as an OP. ID also recommended evaluation by dermatology for consideration for biopsy as an OP if not improved.   On hospital day  4, he was seen and examined by Dr. Tresa Endo, who determined he was stable for discharge home. Post hospital f/u has been arranged with Corine Shelter, PA-C, on 09/01/14. He will be followed long term by Dr. Herbie Baltimore.   Consults: None  Significant Diagnostic Studies:  LHC 08/07/14 POST-OPERATIVE DIAGNOSIS:  Severe three-vessel diseasewith 100% occlusion of mid LAD and mid D2 with tandem 80-90% stenoses in mid OM 2. based on the EKG showing inferolateral ST elevations, it was unclear what the culprit lesion was, therefore decision was made to intervene on the circumflex an attempt the LAD if able to wire. Successful PCI of the mid circumflex OM2 with 2 overlapping DES  Successful PCI of mid LAD with 2 overlapping DES with suboptimal distal result, potentially due to distal embolization and a small-caliber vessel  Residual occluded mid D2 which is a moderate caliber, albeit tortuous vessel. There was faint TIMI 1 flow beyond what appeared to be occlusion after wiring the vessel.  Normal LVEDP.   2D Echo 08/09/14 Study Conclusions  - Procedure narrative: Transthoracic echocardiography. Image quality was suboptimal, due to poor sound transmission. - Left ventricle: The cavity size was normal. Wall thickness was increased in a pattern of moderate LVH. Systolic function was mildly to moderately reduced. The estimated ejection fraction was approximately 40%. Features are consistent with a pseudonormal left ventricular filling pattern, with concomitant abnormal relaxation and increased filling pressure (grade 2 diastolic dysfunction). Doppler parameters are consistent with high ventricular filling pressure. - Regional wall motion abnormality: Akinesis of the apical  myocardium; moderate hypokinesis of the apical inferior, mid inferolateral, mid anterolateral, apical septal, and apical lateral myocardium; mild hypokinesis of the apical anterior myocardium. - Mitral valve: There was mild regurgitation. -  Systemic veins: IVC dilated with normal respiratory variation. Estimated CVP 8 mmHg.   Treatments: See Hospital Course  Discharge Exam: Blood pressure 102/80, pulse 78, temperature 98.5 F (36.9 C), temperature source Oral, resp. rate 18, height  (1.803 m), weight 229 lb 4.5 oz (104 kg), SpO2 95.00%.   Disposition: 01-Home or Self Care     Medication List    STOP taking these medications       lovastatin 40 MG tablet  Commonly known as:  MEVACOR      TAKE these medications       acetaminophen 500 MG tablet  Commonly known as:  TYLENOL  Take 1,000 mg by mouth every 6 (six) hours as needed for moderate pain.     aspirin 81 MG chewable tablet  Chew 1 tablet (81 mg total) by mouth daily.     atorvastatin 40 MG tablet  Commonly known as:  LIPITOR  Take 1 tablet (40 mg total) by mouth daily at 6 PM.     carvedilol 25 MG tablet  Commonly known as:  COREG  Take 25 mg by mouth 2 (two) times daily with a meal.     gabapentin 600 MG tablet  Commonly known as:  NEURONTIN  Take 600 mg by mouth 2 (two) times daily.     hydrochlorothiazide 12.5 MG capsule  Commonly known as:  MICROZIDE  Take 1 capsule (12.5 mg total) by mouth daily.     insulin lispro protamine-lispro (75-25) 100 UNIT/ML Susp injection  Commonly known as:  HUMALOG 75/25 MIX  Inject 25-50 Units into the skin 2 (two) times daily with a meal.     lisinopril 20 MG tablet  Commonly known as:  PRINIVIL,ZESTRIL  Take 1 tablet (20 mg total) by mouth daily.     nitroGLYCERIN 0.4 MG SL tablet  Commonly known as:  NITROSTAT  Place 1 tablet (0.4 mg total) under the tongue every 5 (five) minutes as needed for chest pain.     ranitidine 75 MG tablet  Commonly known as:  ZANTAC  Take 75 mg by mouth 2 (two) times daily as needed for heartburn.     sulfamethoxazole-trimethoprim 800-160 MG per tablet  Commonly known as:  BACTRIM DS  Take 2 tablets by mouth 2 (two) times daily.     ticagrelor 90 MG Tabs tablet   Commonly known as:  BRILINTA  Take 1 tablet (90 mg total) by mouth 2 (two) times daily.     ticagrelor 90 MG Tabs tablet  Commonly known as:  BRILINTA  Take 1 tablet (90 mg total) by mouth 2 (two) times daily.       Follow-up Information   Follow up with Abelino Derrick, PA-C On 09/01/2014. (8:00 am (cardiology follow-up))    Specialty:  Cardiology   Contact information:   [address] [place] [address] [phone]      TIME SPENT ON DISCHARGE INCLUDING PHYSICIAN TIME: > 30 MINUTES   Signed: Robbie Lis 08/11/2014, 12:59 PM

## 2014-08-21 ENCOUNTER — Telehealth: Payer: Self-pay | Admitting: *Deleted

## 2014-08-21 NOTE — Telephone Encounter (Signed)
FAXED SIGNED  CARDIAC REHAB.- REFERRAL FORM

## 2014-08-26 ENCOUNTER — Ambulatory Visit (HOSPITAL_BASED_OUTPATIENT_CLINIC_OR_DEPARTMENT_OTHER)
Admission: RE | Admit: 2014-08-26 | Discharge: 2014-08-26 | Disposition: A | Discharge status: Home / Self Care | Payer: BC Managed Care – PPO | Source: Ambulatory Visit | Attending: Medical | Admitting: Medical

## 2014-08-26 ENCOUNTER — Ambulatory Visit (INDEPENDENT_AMBULATORY_CARE_PROVIDER_SITE_OTHER): Payer: BC Managed Care – PPO | Admitting: Medical

## 2014-08-26 ENCOUNTER — Encounter: Payer: Self-pay | Admitting: Medical

## 2014-08-26 VITALS — BP 105/72 | HR 84 | Temp 97.6°F | Ht 71.3 in | Wt 226.2 lb

## 2014-08-26 DIAGNOSIS — L02419 Cutaneous abscess of limb, unspecified: Secondary | ICD-10-CM

## 2014-08-26 DIAGNOSIS — I2510 Atherosclerotic heart disease of native coronary artery without angina pectoris: Secondary | ICD-10-CM

## 2014-08-26 DIAGNOSIS — M898X6 Other specified disorders of bone, lower leg: Secondary | ICD-10-CM

## 2014-08-26 DIAGNOSIS — E119 Type 2 diabetes mellitus without complications: Secondary | ICD-10-CM

## 2014-08-26 DIAGNOSIS — K219 Gastro-esophageal reflux disease without esophagitis: Secondary | ICD-10-CM | POA: Insufficient documentation

## 2014-08-26 DIAGNOSIS — M79609 Pain in unspecified limb: Secondary | ICD-10-CM | POA: Insufficient documentation

## 2014-08-26 DIAGNOSIS — E785 Hyperlipidemia, unspecified: Secondary | ICD-10-CM

## 2014-08-26 DIAGNOSIS — L03116 Cellulitis of left lower limb: Secondary | ICD-10-CM

## 2014-08-26 DIAGNOSIS — I2119 ST elevation (STEMI) myocardial infarction involving other coronary artery of inferior wall: Secondary | ICD-10-CM

## 2014-08-26 DIAGNOSIS — I10 Essential (primary) hypertension: Secondary | ICD-10-CM

## 2014-08-26 DIAGNOSIS — L03119 Cellulitis of unspecified part of limb: Secondary | ICD-10-CM

## 2014-08-26 MED ORDER — DOXYCYCLINE HYCLATE 100 MG PO TABS: 100.0000 mg | ORAL_TABLET | Freq: Two times a day (BID) | ORAL | Status: DC

## 2014-08-26 NOTE — Progress Notes (Signed)
   Subjective:    Patient ID: Jonathan Clayton, male    DOB: 27-Dec-1965, 48 y.o.   MRN: [id number]  HPI  Pt htn hx- About 15 yrs. He is on lisinipril, carvedilol and hctz. Pt bp is controlled today little on low side.  Pt diabetes and insulin humolog 50 am and 25 pm. Last a1-c 6 range. 3-4 months ago.  Stroke history-  No current symptoms.  Hx of MI. Just recently in August and MI with 4 stents placed. He will see cardiologist this coming Tuesday.  Hyperlipidemia- Pt has not checked lipids in a while. Pt has difficulty eating well.  Pt was employed at [employer], also Programmer, multimedia. Drinks diet [employer]. 2 liters a week. No exercise. Pt has  3 grown children. Pt one yr college.  Genella Rife- He states occasionally. None recently. He rarely takes rantidine.  Lt leg red, swollen area at beginning of august. Pt has been on antibiotic while he was on antibiotic.2 infectious disease doctors checked area but they could never determine cause. They speculated brown recluse bite.        Review of Systems  Constitutional: Negative for fever, chills and fatigue.  HENT: Negative.   Respiratory: Negative for cough, choking, chest tightness and wheezing.   Cardiovascular: Negative for chest pain and palpitations.       Only reports some occasional transient one second pain over chest. Very atypical and no associated signs or symptoms. None presently during the interview and exam.  Gastrointestinal: Negative.   Endocrine: Negative for polydipsia, polyphagia and polyuria.  Genitourinary: Negative.   Musculoskeletal: Negative for back pain, joint swelling, myalgias, neck pain and neck stiffness.  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  Neurological: Negative.   Hematological: Negative for adenopathy.  Psychiatric/Behavioral: Negative.        Objective:   Physical Exam  General Mental Status- Alert. General Appearance- Not in acute distress.   Skin General: Color-  Normal Color. Moisture- Normal Moisture.  Neck Carotid Arteries- Normal color. Moisture- Normal Moisture. No carotid bruits. No JVD.  Chest and Lung Exam Auscultation: Breath Sounds:-Normal.  Cardiovascular Auscultation:Rythm- Regular. Murmurs & Other Heart Sounds:Auscultation of the heart reveals- No Murmurs.  Abdomen Inspection:-Inspeection Normal. Palpation/Percussion:Note:No mass. Palpation and Percussion of the abdomen reveal- Non Tender, Non Distended + BS, no rebound or guarding.    Neurologic Cranial Nerve exam:- CN III-XII intact(No nystagmus), symmetric smile. Drift Test:- No drift. Romberg Exam:- Negative.  Heal to Toe Gait exam:-Normal. Finger to Nose:- Normal/Intact Strength:- 5/5 equal and symmetric strength both upper and lower extremities.   Skin- Lt pretibial/tibial tuberosity region 6 cm area of pink/purple colored skin with 2cm scab with serosanguinous drainage.  The exam bilateral-no ulcerations on his feet on inspection. Normal sensation intact. Good dorsalis pedis and posterior tibial pulses.       Assessment & Plan:

## 2014-08-26 NOTE — Assessment & Plan Note (Signed)
Patient's blood pressure is currently under control. It may be a little low side but I won't make any changes presently. Rather I will defer any changes to his cardiologist. He is going to see the cardiologist on this coming Tuesday.

## 2014-08-26 NOTE — Assessment & Plan Note (Signed)
He has a chronic nonhealing wound in the left upper tibia area. He is a diabetic and this nonhealing wound has been going on for about a month. I did a wound culture today and we'll go ahead and send him to wound care center. I did start him on doxycycline 100 mg twice a day x10 days. We'll recheck this wound in 5 days or sooner if needed. Note today I did go ahead and make that referral.

## 2014-08-26 NOTE — Assessment & Plan Note (Signed)
Occasional mild reflux and he takes Zantac on occasion. Does not describe chronic daily reflux.

## 2014-08-26 NOTE — Assessment & Plan Note (Signed)
This myocardial infarction was just recent. He had hospitalization and had 4 stents placed. Patient is on brilanta. He has a appointment with cardiologist this coming Tuesday.

## 2014-08-26 NOTE — Assessment & Plan Note (Signed)
This is known and patient had a recent MI.

## 2014-08-26 NOTE — Assessment & Plan Note (Signed)
Patient is currently on atorvastatin. We will get a lipid panel fasting in the near future.

## 2014-08-26 NOTE — Assessment & Plan Note (Signed)
Patient has diabetes history for 15 years. He has not had a when seen done in the last 3-4 months. He is on insulin. We'll get A1c today and make decision on any medication changes.

## 2014-08-26 NOTE — Patient Instructions (Signed)
For you various medical condition htn, hx of stroke, hx of Mi, diabetes, and  hypertlipidemia, continue with currrent medications. Please signs release of information form so we can get info from prior provider. If you run out of any chronic meds give [place] a call. I will defer refills of brilinta and carvedilol to your cardiologist.   For your occasional gerd you can take zantac.  For your left leg infection, I will get tibia/fibula xray, cbc today and wound culture. Please start bactrim DS.. I am also referring you to wound care.   Get other labs today in our office.   Follow up in 5 days to recheck leg wound or as needed.

## 2014-08-27 LAB — CBC WITH DIFFERENTIAL/PLATELET
BASOS PCT: 0.4 % (ref 0.0–3.0)
Basophils Absolute: 0 10*3/uL (ref 0.0–0.1)
EOS PCT: 1.8 % (ref 0.0–5.0)
Eosinophils Absolute: 0.2 10*3/uL (ref 0.0–0.7)
HCT: 37.2 % — ABNORMAL LOW (ref 39.0–52.0)
Hemoglobin: 12.2 g/dL — ABNORMAL LOW (ref 13.0–17.0)
LYMPHS PCT: 23.2 % (ref 12.0–46.0)
Lymphs Abs: 2.1 10*3/uL (ref 0.7–4.0)
MCHC: 32.7 g/dL (ref 30.0–36.0)
MCV: 91.2 fl (ref 78.0–100.0)
MONO ABS: 0.8 10*3/uL (ref 0.1–1.0)
Monocytes Relative: 8.5 % (ref 3.0–12.0)
NEUTROS PCT: 66.1 % (ref 43.0–77.0)
Neutro Abs: 5.8 10*3/uL (ref 1.4–7.7)
PLATELETS: 442 10*3/uL — AB (ref 150.0–400.0)
RBC: 4.09 Mil/uL — AB (ref 4.22–5.81)
RDW: 15.3 % (ref 11.5–15.5)
WBC: 8.8 10*3/uL (ref 4.0–10.5)

## 2014-08-27 LAB — COMPREHENSIVE METABOLIC PANEL
ALBUMIN: 3.4 g/dL — AB (ref 3.5–5.2)
ALK PHOS: 76 U/L (ref 39–117)
ALT: 28 U/L (ref 0–53)
AST: 22 U/L (ref 0–37)
BUN: 32 mg/dL — AB (ref 6–23)
CALCIUM: 9.2 mg/dL (ref 8.4–10.5)
CHLORIDE: 103 meq/L (ref 96–112)
CO2: 27 mEq/L (ref 19–32)
Creatinine, Ser: 1.8 mg/dL — ABNORMAL HIGH (ref 0.4–1.5)
GFR: 44.21 mL/min — ABNORMAL LOW (ref >60.00)
Glucose, Bld: 195 mg/dL — ABNORMAL HIGH (ref 70–99)
POTASSIUM: 4.1 meq/L (ref 3.5–5.1)
SODIUM: 138 meq/L (ref 135–145)
TOTAL PROTEIN: 7.8 g/dL (ref 6.0–8.3)
Total Bilirubin: 0.2 mg/dL (ref 0.2–1.2)

## 2014-08-27 LAB — HEMOGLOBIN A1C: Hgb A1c MFr Bld: 13.7 % — ABNORMAL HIGH (ref 4.6–6.5)

## 2014-08-29 ENCOUNTER — Telehealth: Payer: Self-pay | Admitting: Medical

## 2014-08-29 DIAGNOSIS — E1159 Type 2 diabetes mellitus with other circulatory complications: Secondary | ICD-10-CM

## 2014-08-29 LAB — WOUND CULTURE: GRAM STAIN: NONE SEEN

## 2014-08-29 NOTE — Telephone Encounter (Signed)
Let me know you got long message on his labs. There was a timeout when I made note.

## 2014-08-29 NOTE — Telephone Encounter (Signed)
Pt has some staph on his leg culture. I gave doxycycline. Is his leg improving?? His platlets are mild high. Will repeat platlets in future. His kidney function is low. His a1-c is above 13.7. Correspondng to  bs level  over 400 on average. This high bs will slow healing of wounds thus his chronic leg infection. Increase bs effects  Kidneys and bad on cardiovascular system(He had MI recently). I will refer him to endocrinologist. I strongly recommend this. He is already on insulin. So he needs aggressive management by specialist. If his blood sugar increases may get diabetic keto acidosis, may have complication of leg wound(amputation), and kidney function decrease function then (dialysis.) He is new to me. Needs to be seen by endocrine. Our office can be primary care. How much insulin is he taking currently? If you need read this to him since a lot to summarize. Then if he has any questions let me know or I can talk with him. I will go ahead and put referral.

## 2014-09-01 ENCOUNTER — Ambulatory Visit (INDEPENDENT_AMBULATORY_CARE_PROVIDER_SITE_OTHER): Payer: BC Managed Care – PPO | Admitting: Cardiology

## 2014-09-01 ENCOUNTER — Encounter: Payer: Self-pay | Admitting: Cardiology

## 2014-09-01 VITALS — BP 124/90 | HR 83 | Ht 71.0 in | Wt 225.7 lb

## 2014-09-01 DIAGNOSIS — E119 Type 2 diabetes mellitus without complications: Secondary | ICD-10-CM

## 2014-09-01 DIAGNOSIS — I2119 ST elevation (STEMI) myocardial infarction involving other coronary artery of inferior wall: Secondary | ICD-10-CM

## 2014-09-01 DIAGNOSIS — I2589 Other forms of chronic ischemic heart disease: Secondary | ICD-10-CM

## 2014-09-01 DIAGNOSIS — I10 Essential (primary) hypertension: Secondary | ICD-10-CM

## 2014-09-01 DIAGNOSIS — I255 Ischemic cardiomyopathy: Secondary | ICD-10-CM

## 2014-09-01 DIAGNOSIS — L03119 Cellulitis of unspecified part of limb: Secondary | ICD-10-CM

## 2014-09-01 DIAGNOSIS — L02419 Cutaneous abscess of limb, unspecified: Secondary | ICD-10-CM

## 2014-09-01 DIAGNOSIS — E785 Hyperlipidemia, unspecified: Secondary | ICD-10-CM

## 2014-09-01 DIAGNOSIS — I25118 Atherosclerotic heart disease of native coronary artery with other forms of angina pectoris: Secondary | ICD-10-CM

## 2014-09-01 DIAGNOSIS — L03116 Cellulitis of left lower limb: Secondary | ICD-10-CM

## 2014-09-01 DIAGNOSIS — Z8673 Personal history of transient ischemic attack (TIA), and cerebral infarction without residual deficits: Secondary | ICD-10-CM

## 2014-09-01 DIAGNOSIS — I2510 Atherosclerotic heart disease of native coronary artery without angina pectoris: Secondary | ICD-10-CM

## 2014-09-01 DIAGNOSIS — I209 Angina pectoris, unspecified: Secondary | ICD-10-CM

## 2014-09-01 HISTORY — DX: Ischemic cardiomyopathy: I25.5

## 2014-09-01 MED ORDER — TICAGRELOR 90 MG PO TABS: 90.0000 mg | ORAL_TABLET | Freq: Two times a day (BID) | ORAL | Status: DC

## 2014-09-01 MED ORDER — CARVEDILOL 25 MG PO TABS: 25.0000 mg | ORAL_TABLET | Freq: Two times a day (BID) | ORAL | Status: DC

## 2014-09-01 NOTE — Patient Instructions (Signed)
Your physician recommends that you schedule a follow-up appointment in: 3 months with Dr. Herbie Baltimore. No changes were made today in your therapy,

## 2014-09-01 NOTE — Assessment & Plan Note (Signed)
Pt has balance issues, uses a cane, applying for disability

## 2014-09-01 NOTE — Progress Notes (Signed)
09/01/2014 Jonathan Clayton   1966/02/10  [id number]  Primary Physicia Saguier, Kateri Mc Primary Cardiologist: Dr Herbie Baltimore  HPI:  48 y/o unemployed male with no prior cardiac history but with PMH significant for IDDM, HTN, HLD and prior CVA. He is in the process of applying for disability for his stroke. He recently moved here from [place]. Pt was admitted 08/07/14 through the ER for a wound on his leg. He mentioned he had had some chest pain. His EKG revelaed an inferolateral STEMI. Emergent cardiac cath was  performed by Dr. Herbie Baltimore. This revealed severe 3VD with 100% occlusion of the mid LAD and mid D2 with tandem 80-90% stenosis in the mid OM2. He had successful 2 vessel PCI to the LAD and OM2. His EF was 40-45%. He was placed on DAPT with ASA and Brilinta, BB, ACE-I and statin therapy. He was seen by ID for his leg wound and placed on antibiotics.           Since discharge he has done well from a cardiac standpoint. He denies chest pain but has had some DOE. He denies orthopnea. He has been established with a PCP. He is attending cardiac rehab in [facility].     Current Outpatient Prescriptions  Medication Sig Dispense Refill  . acetaminophen (TYLENOL) 500 MG tablet Take 1,000 mg by mouth every 6 (six) hours as needed for moderate pain.      Marland Kitchen aspirin 81 MG chewable tablet Chew 1 tablet (81 mg total) by mouth daily.      Marland Kitchen atorvastatin (LIPITOR) 40 MG tablet Take 1 tablet (40 mg total) by mouth daily at 6 PM.  30 tablet  5  . carvedilol (COREG) 25 MG tablet Take 1 tablet (25 mg total) by mouth 2 (two) times daily with a meal.  60 tablet  10  . doxycycline (VIBRA-TABS) 100 MG tablet Take 1 tablet (100 mg total) by mouth 2 (two) times daily.  20 tablet  0  . gabapentin (NEURONTIN) 600 MG tablet Take 600 mg by mouth 2 (two) times daily.      . hydrochlorothiazide (MICROZIDE) 12.5 MG capsule Take 1 capsule (12.5 mg total) by mouth daily.  30 capsule  5  . insulin lispro protamine-lispro  (HUMALOG 75/25 MIX) (75-25) 100 UNIT/ML SUSP injection Inject 25-50 Units into the skin 2 (two) times daily with a meal.      . lisinopril (PRINIVIL,ZESTRIL) 20 MG tablet Take 1 tablet (20 mg total) by mouth daily.  30 tablet  5  . nitroGLYCERIN (NITROSTAT) 0.4 MG SL tablet Place 1 tablet (0.4 mg total) under the tongue every 5 (five) minutes as needed for chest pain.  25 tablet  2  . ranitidine (ZANTAC) 75 MG tablet Take 75 mg by mouth 2 (two) times daily as needed for heartburn.       . ticagrelor (BRILINTA) 90 MG TABS tablet Take 1 tablet (90 mg total) by mouth 2 (two) times daily.  60 tablet  10   No current facility-administered medications for this visit.    No Known Allergies  History   Social History  . Marital Status: Married    Spouse Name: N/A    Number of Children: N/A  . Years of Education: N/A   Occupational History  . Not on file.   Social History Main Topics  . Smoking status: Never Smoker   . Smokeless tobacco: Not on file  . Alcohol Use: No  . Drug Use: No  .  Sexual Activity: Yes   Other Topics Concern  . Not on file   Social History Narrative  . No narrative on file     Review of Systems: General: negative for chills, fever, night sweats or weight changes.  Cardiovascular: negative for chest pain, dyspnea on exertion, edema, orthopnea, palpitations, paroxysmal nocturnal dyspnea or shortness of breath Dermatological: negative for rash Respiratory: negative for cough or wheezing Urologic: negative for hematuria Abdominal: negative for nausea, vomiting, diarrhea, bright red blood per rectum, melena, or hematemesis Neurologic: negative for visual changes, syncope, or dizziness All other systems reviewed and are otherwise negative except as noted above.    Blood pressure 124/90, pulse 83, height 5\' 11"  (1.803 m), weight 225 lb 11.2 oz (102.377 kg).  General appearance: alert, cooperative, no distress and moderately obese Neck: no carotid bruit and no  JVD Lungs: clear to auscultation bilaterally Heart: regular rate and rhythm Extremities: Lt shin wound appears to be healing  EKG NSR, inferior Qs, lateral ST changes- similar to  His post PCI EKG.   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 I explained that it is not uncommon to have a sensation of dyspnea for a few weeks on Brilinta- I do not think he had CHF. I encouraged him to attend Cardiac Rehab.  He will see Dr Herbie Baltimore in 3 months.   [name] KPA-C 09/01/2014 8:45 AM

## 2014-09-01 NOTE — Assessment & Plan Note (Signed)
Initial office visit post hospital

## 2014-09-01 NOTE — Assessment & Plan Note (Signed)
No CHF on exam today 

## 2014-09-01 NOTE — Assessment & Plan Note (Signed)
On statin, LDL was 82

## 2014-09-01 NOTE — Assessment & Plan Note (Signed)
HGb A1c was 13 in the hospital

## 2014-09-01 NOTE — Assessment & Plan Note (Signed)
Suspected Brown Recluse spider bite

## 2014-09-01 NOTE — Assessment & Plan Note (Signed)
No recurrent chest pain. 

## 2014-09-01 NOTE — Assessment & Plan Note (Signed)
Controlled.  

## 2014-09-02 ENCOUNTER — Encounter: Payer: Self-pay | Admitting: Medical

## 2014-09-02 ENCOUNTER — Ambulatory Visit (INDEPENDENT_AMBULATORY_CARE_PROVIDER_SITE_OTHER): Payer: BC Managed Care – PPO | Admitting: Medical

## 2014-09-02 VITALS — BP 108/75 | HR 81 | Temp 98.5°F | Ht 69.0 in | Wt 222.4 lb

## 2014-09-02 DIAGNOSIS — L03119 Cellulitis of unspecified part of limb: Secondary | ICD-10-CM

## 2014-09-02 DIAGNOSIS — E118 Type 2 diabetes mellitus with unspecified complications: Principal | ICD-10-CM

## 2014-09-02 DIAGNOSIS — E119 Type 2 diabetes mellitus without complications: Secondary | ICD-10-CM

## 2014-09-02 DIAGNOSIS — L03116 Cellulitis of left lower limb: Secondary | ICD-10-CM

## 2014-09-02 DIAGNOSIS — E1165 Type 2 diabetes mellitus with hyperglycemia: Secondary | ICD-10-CM

## 2014-09-02 DIAGNOSIS — IMO0002 Reserved for concepts with insufficient information to code with codable children: Secondary | ICD-10-CM

## 2014-09-02 DIAGNOSIS — L02419 Cutaneous abscess of limb, unspecified: Secondary | ICD-10-CM

## 2014-09-02 LAB — GLUCOSE, POCT (MANUAL RESULT ENTRY): POC Glucose: 230 mg/dl — AB (ref 70–99)

## 2014-09-02 MED ORDER — DOXYCYCLINE HYCLATE 100 MG PO TABS: 100.0000 mg | ORAL_TABLET | Freq: Two times a day (BID) | ORAL | Status: DC

## 2014-09-02 MED ORDER — MUPIROCIN CALCIUM 2 % EX CREA: 1.0000 "application " | TOPICAL_CREAM | Freq: Two times a day (BID) | CUTANEOUS | Status: DC

## 2014-09-02 NOTE — Assessment & Plan Note (Addendum)
I am making appointment for him to see endocrinologist. Explained this is extreme important to keep this appointment. We will notify him when appointment is made. With a1-c 13.7 I explained he needs endocrinologist. I discussed with MI and stroke hx as well as renal insufficiency(as well as non healing wound) that endocrine is necessary and with him over 400 on average, I think endocrine is required.  See avs instructions.

## 2014-09-02 NOTE — Progress Notes (Signed)
   Subjective:    Patient ID: Jonathan Clayton, male    DOB: Aug 24, 1966, 48 y.o.   MRN: [id number]  HPI  Pt in stating he is doing well. Here to check up on his leg wound. Pt xray was negative. Pt a1-c is 13.7. Wound care appointment is pending. Pt has been on antibiotic.  His culture came back positive for staph. Doxy was on the sensitivity report. He has 4.5 days more left.   Pt is on insulin he is on 50 units in the am and 25 at night. Pt has refills of that. Pt has not been checking his bs. Past couple of months have been hectic.He is on humalog kwick  Pen 75/25.    Review of Systems  Constitutional: Negative for fever, chills and fatigue.  HENT: Negative.   Respiratory: Negative for cough, shortness of breath and wheezing.   Cardiovascular: Negative for chest pain and palpitations.  Gastrointestinal: Negative.   Musculoskeletal:       Mild left leg pain over tibia when area touched.  Skin:       Area still broke down over tibial tuberosity left leg.  Neurological: Negative.   Hematological: Negative for adenopathy. Does not bruise/bleed easily.       Objective:   Physical Exam  Constitutional: He is oriented to person, place, and time. He appears well-developed and well-nourished.  Cardiovascular: Normal rate, regular rhythm and normal heart sounds.  Exam reveals no gallop and no friction rub.   No murmur heard. Pulmonary/Chest: Effort normal and breath sounds normal. No respiratory distress. He has no wheezes. He has no rales. He exhibits no tenderness.  Neurological: He is alert and oriented to person, place, and time. No cranial nerve deficit. Coordination normal.  CN III- XII grossly intact.  Skin:  Lt lower ext- Still has nonhealing wound over tibial tuberosity. Appears same as before not worse. Still tender. Scab still present.  Psychiatric: He has a normal mood and affect. His behavior is normal. Judgment and thought content normal.          Assessment & Plan:

## 2014-09-02 NOTE — Patient Instructions (Signed)
Pt in for follow up.  I advised referring to both endocrinologist and wound care. Your blood sugar average was over 400. So we need endocrinologist to get your blood sugar down. I am concerned that if bs increase further you may suffer complications. With wounds that won't heal sometimes patients lose extremity(amputation). So please see both specialist.  During the interim, I want you to check your bs twice daily in am fasting and before your evening meal. I want you to start slowly increasing your bs by 1-2 units in am and pm until your bs reach 100's. If you would call me and let me know how many units it takes to get to 100 bs range. If you feel ill I want you to check bs randomly make sure no hypoglycemic events.   Follow up in 1 month or as needed.  I sent more antibiotics to your pharmacy and antibiotic cream. If your leg looks worse before specialist come in asap.

## 2014-09-02 NOTE — Assessment & Plan Note (Signed)
Non healing chronic wound. I don't think severe cellulitis now. Based on sugars being on average 400 or more, I think wound just won't heal.  Some bacteria present but on antibiotic and bacteria is sensitive. Pt has appointment with wound care and he was given hand written sheet informing him of appointment this month. Stress keep that appointment. He now has 8 more days of antibiotic to take. Also gave him some topical to use. If wound worsens before wound care appointment then rtc.

## 2014-09-03 ENCOUNTER — Telehealth: Payer: Self-pay | Admitting: Cardiology

## 2014-09-03 NOTE — Telephone Encounter (Signed)
Informed Teresa  Cardiac rehab referral form faxed and signed

## 2014-09-11 ENCOUNTER — Ambulatory Visit: Payer: BC Managed Care – PPO | Admitting: Endocrinology

## 2014-09-16 ENCOUNTER — Telehealth: Payer: Self-pay | Admitting: Cardiology

## 2014-09-16 NOTE — Telephone Encounter (Signed)
Spoke with Dr. Tresa Endo (DOD) >> he advised that patient HOLD hctz 12.5mg  (as he is out of it currently anyway) and decrease lisinopril to 10mg  daily.   He recommends a sooner f/up than December.   This was communicated to [facility] who will inform patient as he is still at rehab.   Message forwarded to Dr. Elissa Hefty scheduler to set up an office visit

## 2014-09-16 NOTE — Telephone Encounter (Signed)
Spoke with Jonathan Clayton with [facility] cardiac rehab. Patient presented for 1st rehab visit today   BP prior to exercise 110/60 20 minutes of low intensity activity >> BP 77/50 - patient unsteady, pale, dizzy .Marland Kitchen Jonathan Clayton sat patient down and he drank 12oz water >> no change in BP  He rested for duration of rehab session, sat in recliner with feet propped up and highest BP was 90/60. His color came back but he was still unsteady.   Patient reported to Jonathan Clayton that he gets dizzy and "washed out" with any exertion and gets easily SOB, especially when lying down.   Patient has NOT take hctz 12.5mg  in 2 days (has been out)  Patient post MI - OV with [facility], [place] on 9/22 (complaints of DOE)

## 2014-09-16 NOTE — Telephone Encounter (Signed)
Called pt and LVM giving him appt date and time 10/13 at 8:30am with Dr. Herbie Baltimore

## 2014-09-16 NOTE — Telephone Encounter (Signed)
Jonathan Clayton is calling because Jonathan Clayton is at Cardiac Rehab with her right now and his blood pressure is low 75/50 and she has given him water and elevated his feet and the highest that she has gotten it up to is 90/50 and he is dizzy , pale and unsteady on his feet . Please call    Thanks

## 2014-09-22 ENCOUNTER — Encounter: Payer: Self-pay | Admitting: Cardiology

## 2014-09-22 ENCOUNTER — Ambulatory Visit (INDEPENDENT_AMBULATORY_CARE_PROVIDER_SITE_OTHER): Payer: BC Managed Care – PPO | Admitting: Cardiology

## 2014-09-22 VITALS — BP 120/90 | HR 82 | Ht 71.0 in | Wt 229.5 lb

## 2014-09-22 DIAGNOSIS — Z9861 Coronary angioplasty status: Secondary | ICD-10-CM

## 2014-09-22 DIAGNOSIS — I2119 ST elevation (STEMI) myocardial infarction involving other coronary artery of inferior wall: Secondary | ICD-10-CM

## 2014-09-22 DIAGNOSIS — I9589 Other hypotension: Secondary | ICD-10-CM

## 2014-09-22 DIAGNOSIS — I10 Essential (primary) hypertension: Secondary | ICD-10-CM

## 2014-09-22 DIAGNOSIS — I2510 Atherosclerotic heart disease of native coronary artery without angina pectoris: Secondary | ICD-10-CM

## 2014-09-22 DIAGNOSIS — E669 Obesity, unspecified: Secondary | ICD-10-CM

## 2014-09-22 DIAGNOSIS — I255 Ischemic cardiomyopathy: Secondary | ICD-10-CM

## 2014-09-22 DIAGNOSIS — E785 Hyperlipidemia, unspecified: Secondary | ICD-10-CM

## 2014-09-22 DIAGNOSIS — E784 Other hyperlipidemia: Secondary | ICD-10-CM

## 2014-09-22 MED ORDER — CARVEDILOL 12.5 MG PO TABS: 12.5000 mg | ORAL_TABLET | Freq: Two times a day (BID) | ORAL | Status: DC

## 2014-09-22 NOTE — Patient Instructions (Signed)
CHECK BLOOD PRESSURE IN MORNING IF TOP NUMBER IS BELOW 110 - TAKE 1/2 TABLET OF LISINOPRIL AT LUNCH OTHERWISE TAKE LISINOPRIL AT LUNCH EACH DAY.  CHANGE COREG (CARVEDIOL) 12.5 MG TWICE A DAY.  Your physician wants you to follow-up in 3-4 MONTH DR HARDING. You will receive a reminder letter in the mail two months in advance. If you don't receive a letter, please call our office to schedule the follow-up appointment.

## 2014-09-24 ENCOUNTER — Encounter (HOSPITAL_BASED_OUTPATIENT_CLINIC_OR_DEPARTMENT_OTHER): Payer: BC Managed Care – PPO | Attending: Internal Medicine

## 2014-09-24 DIAGNOSIS — L03116 Cellulitis of left lower limb: Secondary | ICD-10-CM | POA: Insufficient documentation

## 2014-09-24 NOTE — Progress Notes (Signed)
[facility]  NAME:  Jonathan Clayton, Keeven                 ACCOUNT NO.:  [id number]  MEDICAL RECORD NO.:  [id number]      DATE OF BIRTH:  1966-01-29  PHYSICIAN:  Maxwell CaulMichael G. Sanjuan Sawa, M.D. VISIT DATE:  09/24/2014                                  OFFICE VISIT   HISTORY:  Mr. [name] is a 48 year old man kindly referred for a wound on his left leg.  He is referred through his primary physicians at [facility].  The history provided is that roughly a month ago, the patient was [facility] of the left leg wound.  He was found to have acute coronary syndrome and was hospitalized for this.  With regards to the leg wound, he states that this came on as a small scabbed area a short time before he was admitted.  He thinks this may have been a spider bite.  There was some suggestion that he had trauma (fell out of a bed).  In any case, he has been back to see his primary physician.  A culture of this grew Staph aureus.  He has completed a course of antibiotics.  X-ray of this area did not show any evidence of active osteomyelitis.  He is a poorly controlled type 2 diabetic with a hemoglobin A1c over 13. There is no Prior skin issue over this, or other areas that the patient or is wife is aware of.  PAST MEDICAL HISTORY:  Includes coronary artery disease, status post recent, MI, hypertension, type 2 diabetes with diabetic neuropathy, history of gingivitis, history of a CVA.  SURGICAL HISTORY:  Cardiac stents x4.  MEDICATIONS: 1. Aspirin 81 daily. 2. Lipitor 40 daily. 3. Coreg 12.5 b.i.d. 4. Neurontin 600 b.i.d. 5. Hydrochlorothiazide 25 daily. 6. Humalog 75/25, 25 units b.i.d. 7. Lisinopril 20 daily. 8. Nitrostat p.r.n. 9. Zantac 75 b.i.d. p.r.n. 10.Brilinta 90 b.i.d.  PHYSICAL EXAMINATION:  VITAL SIGNS:  Temperature is 98.4, pulse 77, respirations 18, blood pressure 146/94, glucose 250. EXTREMITIES:  Peripheral pulses in the left leg were  palpable.  The area in question is over the left anterior medial leg just below the tibial tuberosity.  This measured 0.4 x 0.3 x 0.3.  There was undermining of roughly 0.5 cm from 3-9 o'clock.  There was some surrounding erythema and tenderness superiorly.  A repeat culture of this wound was done.  I did not add empiric antibiotics, however.  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  There is some suggestion that this may be some form of trauma, although I am not certain about this.  The patient has been using Bactroban to the wound.  I have changed this to Iodosorb with a foam cover, and we will see him back and this has to be changed every second day.  We will see him back in a week's time.  As mentioned, I did a culture, but did not add empiric antibiotics.  The undermining of this from 9-3 o'clock at 0.5 cm may prove problematic.  It is possible that the lower edge of this wound will need to be further debrided.  However, I did not perform this today.  Followup in 1 week.  ______________________________ Maxwell Caul, M.D.     MGR/MEDQ  D:  09/24/2014  T:  09/24/2014  Job:  [id number]

## 2014-09-28 ENCOUNTER — Encounter: Payer: Self-pay | Admitting: Endocrinology

## 2014-09-28 ENCOUNTER — Ambulatory Visit (INDEPENDENT_AMBULATORY_CARE_PROVIDER_SITE_OTHER): Payer: BC Managed Care – PPO | Admitting: Endocrinology

## 2014-09-28 VITALS — BP 150/104 | HR 87 | Temp 98.2°F | Wt 233.0 lb

## 2014-09-28 DIAGNOSIS — E1122 Type 2 diabetes mellitus with diabetic chronic kidney disease: Secondary | ICD-10-CM

## 2014-09-28 DIAGNOSIS — N189 Chronic kidney disease, unspecified: Secondary | ICD-10-CM

## 2014-09-28 MED ORDER — INSULIN GLARGINE 300 UNIT/ML ~~LOC~~ SOPN: 70.0000 [IU] | PEN_INJECTOR | SUBCUTANEOUS | Status: DC

## 2014-09-28 MED ORDER — GLUCOSE BLOOD VI STRP: 1.0000 | ORAL_STRIP | Freq: Two times a day (BID) | Status: DC

## 2014-09-28 NOTE — Progress Notes (Signed)
Subjective:    Patient ID: Jonathan Clayton, male    DOB: 1966/01/04, 48 y.o.   MRN: [id number]  HPI pt states DM was dx'ed in 2000; he has moderate painful neuropathy of the lower extremities; he has associated CAD and renal insufficiency; he has been on insulin since 2010; pt says his diet is poor, and exercise is limited by health problems; he has never had pancreatitis, severe hypoglycemia or DKA. He says he misses the insulin frequently--sometimes for days at a time.   He says he has not recently taken the lisinopril, as he cannot afford it.  He says HCTZ was stopped. Past Medical History  Diagnosis Date  . Diabetic neuropathy   . High cholesterol   . GERD (gastroesophageal reflux disease)   . Hypertension   . Arthritis     Of lower back.  . Stroke     Pt had stroke February 17, 2010.  . Diabetes mellitus without complication     Diabetic x 15 yrs.  . Myocardial infarction     Pt MI 3 wks ago. He got confused initialy he thought  was muscle. Pt is about to see cardiologist. Dr. Corine Shelter.     Past Surgical History  Procedure Laterality Date  . Stints      Place vein in right arm    History   Social History  . Marital Status: Married    Spouse Name: N/A    Number of Children: N/A  . Years of Education: N/A   Occupational History  . Not on file.   Social History Main Topics  . Smoking status: Never Smoker   . Smokeless tobacco: Not on file  . Alcohol Use: No  . Drug Use: No  . Sexual Activity: Yes   Other Topics Concern  . Not on file   Social History Narrative  . No narrative on file    Current Outpatient Prescriptions on File Prior to Visit  Medication Sig Dispense Refill  . acetaminophen (TYLENOL) 500 MG tablet Take 1,000 mg by mouth every 6 (six) hours as needed for moderate pain.      Marland Kitchen aspirin 81 MG chewable tablet Chew 1 tablet (81 mg total) by mouth daily.      Marland Kitchen atorvastatin (LIPITOR) 40 MG tablet Take 1 tablet (40 mg total) by mouth daily at 6 PM.   30 tablet  5  . carvedilol (COREG) 12.5 MG tablet Take 1 tablet (12.5 mg total) by mouth 2 (two) times daily.  60 tablet  11  . gabapentin (NEURONTIN) 600 MG tablet Take 600 mg by mouth 2 (two) times daily.      Marland Kitchen lisinopril (PRINIVIL,ZESTRIL) 20 MG tablet Take 10 mg by mouth daily.      . nitroGLYCERIN (NITROSTAT) 0.4 MG SL tablet Place 1 tablet (0.4 mg total) under the tongue every 5 (five) minutes as needed for chest pain.  25 tablet  2  . ranitidine (ZANTAC) 75 MG tablet Take 75 mg by mouth 2 (two) times daily as needed for heartburn.       . ticagrelor (BRILINTA) 90 MG TABS tablet Take 1 tablet (90 mg total) by mouth 2 (two) times daily.  60 tablet  10   No current facility-administered medications on file prior to visit.    No Known Allergies  Family History  Problem Relation Age of Onset  . Cancer Father   . Diabetes Neg Hx     BP 150/104  Pulse 87  Temp(Src)  98.2 F (36.8 C) (Oral)  Wt 233 lb (105.688 kg)  SpO2 95%   Review of Systems denies headache, chest pain, sob, n/v, urinary frequency, muscle cramps, excessive diaphoresis, depression, cold intolerance, rhinorrhea, and easy bruising.  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 He has weight gain.     Objective:   Physical Exam VS: see vs page GEN: no distress HEAD: head: no deformity eyes: no periorbital swelling, no proptosis external nose and ears are normal mouth: no lesion seen NECK: supple, thyroid is not enlarged CHEST WALL: no deformity LUNGS: clear to auscultation CV: reg rate and rhythm, no murmur ABD: abdomen is soft, nontender.  no hepatosplenomegaly.  not distended.  no hernia MUSCULOSKELETAL: muscle bulk and strength are grossly normal.  no obvious joint swelling.  gait is steady, with a cane EXTEMITIES: no deformity.  no ulcer on the feet.  feet are of normal color and temp.  1+ bilat leg edema PULSES: dorsalis pedis intact bilat.  no carotid bruit NEURO:  cn 2-12 grossly intact.   readily  moves all 4's.  sensation is intact to touch on the feet, but decreased from normal.  SKIN:  Normal texture and temperature.  No rash or suspicious lesion is visible.   NODES:  None palpable at the neck PSYCH: alert, well-oriented.  Does not appear anxious nor depressed.  Lab Results  Component Value Date   HGBA1C 13.7* 08/26/2014   i reviewed electrocardiogram (09/22/14)  i have reviewed the following old records: Office notes    Assessment & Plan:  DM: severe exacerbation, new to me. Severe noncompliance with insulin.  He says this is partially due to economic factors.  He declines to change insulin to syringe and vial.  i'll choose toujeo, as we have a discount card.   HTN: therapy limited by noncompliance.  i'll do the best i can.   Patient is advised the following: Patient Instructions  good diet and exercise habits significanly improve the control of your diabetes.  please let me know if you wish to be referred to a dietician.  high blood sugar is very risky to your health.  you should see an eye doctor and dentist every year.  It is very important to get all recommended vaccinations.  controlling your blood pressure and cholesterol drastically reduces the damage diabetes does to your body.  this also applies to quitting smoking.  please discuss these with your doctor.  check your blood sugar twice a day.  vary the time of day when you check, between before the 3 meals, and at bedtime.  also check if you have symptoms of your blood sugar being too high or too low.  please keep a record of the readings and bring it to your next appointment here.  You can write it on any piece of paper.  please call us sooner if your blood sugar goes below 70, or if you have a lot of readings over 200.   Please resume the lisinopril ASAP, and have your blood pressure rechecked with your heart doctor within the next week.   Please change your current insulin to "toujeo."  i have sent a prescription to  your pharmacy.  You can use up your current insulin first.  Here is a discount card.  i have sent a prescription to your pharmacy, for strips Please come back for a follow-up appointment in 2 weeks.

## 2014-09-28 NOTE — Patient Instructions (Addendum)
good diet and exercise habits significanly improve the control of your diabetes.  please let me know if you wish to be referred to a dietician.  high blood sugar is very risky to your health.  you should see an eye doctor and dentist every year.  It is very important to get all recommended vaccinations.  controlling your blood pressure and cholesterol drastically reduces the damage diabetes does to your body.  this also applies to quitting smoking.  please discuss these with your doctor.  check your blood sugar twice a day.  vary the time of day when you check, between before the 3 meals, and at bedtime.  also check if you have symptoms of your blood sugar being too high or too low.  please keep a record of the readings and bring it to your next appointment here.  You can write it on any piece of paper.  please call us sooner if your blood sugar goes below 70, or if you have a lot of readings over 200.   Please resume the lisinopril ASAP, and have your blood pressure rechecked with your heart doctor within the next week.   Please change your current insulin to "toujeo."  i have sent a prescription to your pharmacy.  You can use up your current insulin first.  Here is a discount card.  i have sent a prescription to your pharmacy, for strips Please come back for a follow-up appointment in 2 weeks.

## 2014-10-01 ENCOUNTER — Encounter: Payer: Self-pay | Admitting: Cardiology

## 2014-10-01 DIAGNOSIS — I9589 Other hypotension: Secondary | ICD-10-CM | POA: Insufficient documentation

## 2014-10-01 DIAGNOSIS — E669 Obesity, unspecified: Secondary | ICD-10-CM | POA: Insufficient documentation

## 2014-10-01 DIAGNOSIS — L03116 Cellulitis of left lower limb: Secondary | ICD-10-CM | POA: Diagnosis not present

## 2014-10-01 NOTE — Assessment & Plan Note (Signed)
His LDL hospital but well-controlled with a scalpel small. He is on a dose of atorvastatin. Will recheck labs and about 3 or 4 months. May need to consider high-dose omega-3 fatty acids to try to increase his HDL levels. He needs to simply increase his exercise level.

## 2014-10-01 NOTE — Assessment & Plan Note (Addendum)
See above. Hopefully this will be a problem anymore. I told her to hold anything he can hold his ACE inhibitor dose.  CHECK BLOOD PRESSURE IN MORNING IF TOP NUMBER IS BELOW 110 - TAKE 1/2 TABLET OF LISINOPRIL AT LUNCH OTHERWISE TAKE LISINOPRIL AT LUNCH EACH DAY.

## 2014-10-01 NOTE — Assessment & Plan Note (Addendum)
Importantly, he was not aware of his symptoms initially and had a relatively late presentation. What is unclear is which lesion which was the culprit. 2 out of the 3 lesions were been done. Significant time, the diagonal branch was not intervened on. In the absence of symptoms, he was not referred for complete revascularization.  He needs continued cardiac rehabilitation.

## 2014-10-01 NOTE — Assessment & Plan Note (Signed)
Doesn't really have any active heart failure symptoms PND orthopnea or edema but does have exertional dyspnea. He does have significant diastolic dysfunction, as no active knee for a diuretic. He does have some hypotension she is reluctant to add spironolactone at this particular time. 1 would like to recheck an echocardiogram when I see him back in 3-4 months simply to reassess his ejection fraction to see if there's been any improvement following revascularization and with optimal medical management.

## 2014-10-01 NOTE — Progress Notes (Signed)
PCP: Esperanza Richters, PA-C  Clinic Note: Chief Complaint  Patient presents with  . Follow-up      Low BP, dizziness, SOB     HPI: Jonathan Clayton is a 48 y.o. male with a PMH below who presents today for his second posthospital followup from inferolateral stating on August 28th.  He saw Corine Shelter, [place] on September 22 and noted mild exertional dyspnea and. No medication changes were made. The results of the dyspnea could be related to his Brilinta.  Past Medical History  Diagnosis Date  . STEMI of inferolateral wall- 08/07/14 08/07/2014  . CAD S/P PCI mLAD & Cx-OM (Promus DES) 08/07/2014    mLAD & D2 100% - PCI to LAD w/ Promus P DES 2.25 x 20 & 8 (2.4), D2 Med Rx; Cx-OM tandem 80% -> PCI Promus P DES 2.75 x 16 & 3.0 x 24 (3 mm distal & 3.25 mm prox)  . Cardiomyopathy, ischemic-EF 40% by echo  09/01/2014    Echo: 8/30: Moderate LVH, EF roughly 40%. Pseudo-normal Gr2 DD, apical akinesis, moderate HK of apical inferior mid inferolateral and anterolateral as well as apical septal and apical lateral walls with mild HK of apical anterior   . Hx of stroke February 17 2010  . Essential hypertension, benign   . Dyslipidemia with high LDL and low HDL   . 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 Diabetic neuropathy associated with type 2 diabetes mellitus   . GERD (gastroesophageal reflux disease)   . Osteoarthritis of back     With chronic low back pain   Interval History:  Since his initial followup, he was seen by his PCP, and an ACE inhibitor was added. He subsequently has had a couple episodes of hypotension where he felt lightheaded and dizzy with standing. He subsequently cut his carvedilol dose back to once a day. He is having less of the hypotension spells, but has noted some intermittent palpitations. His shortness of breath spells have become much less frequent than they were before. He does get some exertional dyspnea and fatigue, has improved. No anginal chest tightness or pressure  with rest or exertion. He does not have any PND, orthopnea or edema. No rapid or irregular heartbeats, syncope/near syncope or TIA/amaurosis fugax.  Of note, when he was initially evaluated for a stone he came to the merchant actually for a sore on his leg that was thought to be a spider bite. It is now somewhat healing after having been on antibiotics. He has been having a hard time with monitoring his blood sugars he does not have a glucose monitor to use at home.    ROS: A comprehensive was performed. Review of Systems  Constitutional: Negative for malaise/fatigue.  HENT: Negative for congestion and nosebleeds.   Respiratory: Positive for shortness of breath. Negative for cough and wheezing.   Cardiovascular: Negative for claudication.       Otherwise negative per history of present illness  Gastrointestinal: Negative for blood in stool and melena.  Genitourinary: Negative for hematuria.  Musculoskeletal:       He is on some pain in his left leg at the site of his right. He also has peripheral neuropathy in his feet.  Neurological: Positive for dizziness. Negative for sensory change, speech change, focal weakness, seizures and loss of consciousness.  Endo/Heme/Allergies: Does not bruise/bleed easily.  Psychiatric/Behavioral: Negative for depression. The patient is not nervous/anxious.   All other systems reviewed and are negative.  Current Outpatient Prescriptions on File Prior to Visit  Medication Sig Dispense Refill  . acetaminophen (TYLENOL) 500 MG tablet Take 1,000 mg by mouth every 6 (six) hours as needed for moderate pain.      Marland Kitchen. aspirin 81 MG chewable tablet Chew 1 tablet (81 mg total) by mouth daily.      Marland Kitchen. atorvastatin (LIPITOR) 40 MG tablet Take 1 tablet (40 mg total) by mouth daily at 6 PM.  30 tablet  5  . gabapentin (NEURONTIN) 600 MG tablet Take 600 mg by mouth 2 (two) times daily.      Marland Kitchen. lisinopril (PRINIVIL,ZESTRIL) 20 MG tablet Take 10 mg by mouth daily.      .  nitroGLYCERIN (NITROSTAT) 0.4 MG SL tablet Place 1 tablet (0.4 mg total) under the tongue every 5 (five) minutes as needed for chest pain.  25 tablet  2  . ranitidine (ZANTAC) 75 MG tablet Take 75 mg by mouth 2 (two) times daily as needed for heartburn.       . ticagrelor (BRILINTA) 90 MG TABS tablet Take 1 tablet (90 mg total) by mouth 2 (two) times daily.  60 tablet  10   No current facility-administered medications on file prior to visit.   ALLERGIES REVIEWED IN EPIC --  no  change SOCIAL AND FAMILY HISTORY REVIEWED IN EPIC --  no  change  Wt Readings from Last 3 Encounters:  09/28/14 233 lb (105.688 kg)  09/22/14 229 lb 8 oz (104.101 kg)  09/02/14 222 lb 6.4 oz (100.88 kg)    PHYSICAL EXAM BP 120/90  Pulse 82  Ht 5\' 11"  (1.803 m)  Wt 229 lb 8 oz (104.101 kg)  BMI 32.02 kg/m2 General appearance: alert, cooperative, appears stated age, no distress and  mildly  obese Neck: no adenopathy, no carotid bruit and no JVD Lungs: CTAB, normal percussion bilaterally and non-labored Heart: RRR, S1&S2 normal, soft S4 no murmur, click, rub; nondisplaced PMI Abdomen: soft, non-tender; bowel sounds normal; no masses,  no organomegaly; mild truncal obesity  Extremities: extremities normal, atraumatic, no cyanosis, or edema Pulses: 2+ and symmetric;  Skin: normal and With the exception of the healing bug bite on his left calf.  Neurologic: Mental status: Alert, oriented, thought content appropriate Cranial nerves: normal (II-XII grossly intact) 6  Adult ECG Report  Rate: 82 ;  Rhythm: normal sinus rhythm; anterior ST-T wave changes with mild ST elevation and T wave inversions from V1 through V5 with T-wave inversions in 1 aVL and V6 consistent with evolving anterior MI.  Narrative Interpretation: The current EKG x-ray shows notable improvement from previous EKG with T-wave inversions and ST elevations being less prominent on current EKG. Also Q waves noted in lead 3 as opposed to the 2,3 and  aVF.  Recent Labs:   Lab Results  Component Value Date   CHOL 144 08/11/2014   HDL 27* 08/11/2014   LDLCALC 82 08/11/2014   TRIG 176* 08/11/2014   CHOLHDL 5.3 08/11/2014     Chemistry      Component Value Date/Time   NA 138 08/26/2014 1402   K 4.1 08/26/2014 1402   CL 103 08/26/2014 1402   CO2 27 08/26/2014 1402   BUN 32* 08/26/2014 1402   CREATININE 1.8* 08/26/2014 1402      Component Value Date/Time   CALCIUM 9.2 08/26/2014 1402   ALKPHOS 76 08/26/2014 1402   AST 22 08/26/2014 1402   ALT 28 08/26/2014 1402   BILITOT 0.2 08/26/2014 1402  ASSESSMENT / PLAN: STEMI of inferolateral wall- 08/07/14 Importantly, he was not aware of his symptoms initially and had a relatively late presentation. What is unclear is which lesion which was the culprit. 2 out of the 3 lesions were been done. Significant time, the diagonal branch was not intervened on. In the absence of symptoms, he was not referred for complete revascularization.  He needs continued cardiac rehabilitation.  CAD S/P PCI mLAD & Cx-OM (Promus DES) He is not having enough discomfort. He did have the diagonal as noted above that was not intervened on. My estimation would be to consider checking a Myoview stress test to look for active ischemia if he continues to have exertional dyspnea at his followup visit. He is on DAPT (ASA & Brilinta), statin, beta blocker (which I will change from 20 mg once a day to cope with 5 mg twice a day close along with non-ACE inhibitor. I do think some of his dyspnea spells are related to the Brilinta. He thinks he can tolerate them. If they do get worse we will switch him over to Plavix, with his history of stroke. Otherwise I think I like to keep him on Brilinta as long as possible.  Cardiomyopathy, ischemic-EF 40% by echo  Doesn't really have any active heart failure symptoms PND orthopnea or edema but does have exertional dyspnea. He does have significant diastolic dysfunction, as no active knee for a  diuretic. He does have some hypotension she is reluctant to add spironolactone at this particular time. 1 would like to recheck an echocardiogram when I see him back in 3-4 months simply to reassess his ejection fraction to see if there's been any improvement following revascularization and with optimal medical management.  Essential hypertension, benign His blood pressure looks great today. He has had some hypotensive issues so he cut back on his beta blocker after having the ACE inhibitor started. Carvedilol as of yet he medications we'll switch her from 25 once daily 12.5 twice a day for more accurate coverage. His also help avoid the hypotensive after taking the higher dose of carvedilol.  Other specified hypotension See above. Hopefully this will be a problem anymore. I told her to hold anything he can hold his ACE inhibitor dose.  CHECK BLOOD PRESSURE IN MORNING IF TOP NUMBER IS BELOW 110 - TAKE 1/2 TABLET OF LISINOPRIL AT LUNCH OTHERWISE TAKE LISINOPRIL AT LUNCH EACH DAY.  Dyslipidemia with high LDL and low HDL His LDL hospital but well-controlled with a scalpel small. He is on a dose of atorvastatin. Will recheck labs and about 3 or 4 months. May need to consider high-dose omega-3 fatty acids to try to increase his HDL levels. He needs to simply increase his exercise level.  Obesity (BMI 30-39.9)  Unfortunately he gained weight back that he lost in the hospital. Hopefully with dietary modification and is exercising and lose some weight which will help his glycemic control as well as lipid control and blood pressure.    Orders Placed This Encounter  Procedures  . EKG 12-Lead   Meds ordered this encounter  Medications  . DISCONTD: carvedilol (COREG) 25 MG tablet    Sig: Take 12.5 mg by mouth daily.  . carvedilol (COREG) 12.5 MG tablet    Sig: Take 1 tablet (12.5 mg total) by mouth 2 (two) times daily.    Dispense:  60 tablet    Refill:  11    Followup: 3-4  months   HARDING,DAVID W, M.D., M.S. Interventional Cardiologist  Pager # [phone]

## 2014-10-01 NOTE — Assessment & Plan Note (Signed)
He is not having enough discomfort. He did have the diagonal as noted above that was not intervened on. My estimation would be to consider checking a Myoview stress test to look for active ischemia if he continues to have exertional dyspnea at his followup visit. He is on DAPT (ASA & Brilinta), statin, beta blocker (which I will change from 20 mg once a day to cope with 5 mg twice a day close along with non-ACE inhibitor. I do think some of his dyspnea spells are related to the Brilinta. He thinks he can tolerate them. If they do get worse we will switch him over to Plavix, with his history of stroke. Otherwise I think I like to keep him on Brilinta as long as possible.

## 2014-10-01 NOTE — Assessment & Plan Note (Signed)
Unfortunately he gained weight back that he lost in the hospital. Hopefully with dietary modification and is exercising and lose some weight which will help his glycemic control as well as lipid control and blood pressure.

## 2014-10-01 NOTE — Assessment & Plan Note (Signed)
His blood pressure looks great today. He has had some hypotensive issues so he cut back on his beta blocker after having the ACE inhibitor started. Carvedilol as of yet he medications we'll switch her from 25 once daily 12.5 twice a day for more accurate coverage. His also help avoid the hypotensive after taking the higher dose of carvedilol.

## 2014-10-02 ENCOUNTER — Ambulatory Visit (INDEPENDENT_AMBULATORY_CARE_PROVIDER_SITE_OTHER): Payer: BC Managed Care – PPO | Admitting: Medical

## 2014-10-02 ENCOUNTER — Encounter: Payer: Self-pay | Admitting: Medical

## 2014-10-02 ENCOUNTER — Other Ambulatory Visit: Payer: Self-pay

## 2014-10-02 VITALS — BP 130/80 | HR 86 | Temp 98.2°F | Wt 233.2 lb

## 2014-10-02 DIAGNOSIS — I10 Essential (primary) hypertension: Secondary | ICD-10-CM

## 2014-10-02 DIAGNOSIS — L03116 Cellulitis of left lower limb: Secondary | ICD-10-CM

## 2014-10-02 DIAGNOSIS — E784 Other hyperlipidemia: Secondary | ICD-10-CM

## 2014-10-02 DIAGNOSIS — E785 Hyperlipidemia, unspecified: Secondary | ICD-10-CM

## 2014-10-02 DIAGNOSIS — Z23 Encounter for immunization: Secondary | ICD-10-CM

## 2014-10-02 NOTE — Assessment & Plan Note (Signed)
The area on left pretibial area is much better now. Continue to follow up with wound care center.

## 2014-10-02 NOTE — Patient Instructions (Addendum)
Pt left leg looks much improved. Continue wound specialist care.  For htn, I will refill your lisinopril today.  Regarding diabetes, follow endocrinologist advise.  Follow up in 3-4 months or as needed.

## 2014-10-02 NOTE — Assessment & Plan Note (Signed)
Pt expressed recent non compliance with lipid med due to cost. I encouraged compliance due to his history and risk factors.

## 2014-10-02 NOTE — Progress Notes (Signed)
Pre visit review using our clinic review tool, if applicable. No additional management support is needed unless otherwise documented below in the visit note. 

## 2014-10-02 NOTE — Progress Notes (Signed)
Subjective:    Patient ID: Jonathan Clayton, male    DOB: 04/21/1966, 48 y.o.   MRN: [id number]  HPI   Pt in for check up on leg wound. [facility]. His lt pretibial wound is very much improved. He states they are applying special cream to area. He is not sure name of but area is improving dramatically.   Pt is seeing cardiologist and endocrinologist.   Pt had carvedilol 12.5 mg adjusted dose twice a day. He needs a refill of his lisinopril.  Pt endocrinologist wants him to switch from insulin toujeo. Pt has the coupon and he wants my opinion on med.  Past Medical History  Diagnosis Date  . STEMI of inferolateral wall- 08/07/14 08/07/2014  . CAD S/P PCI mLAD & Cx-OM (Promus DES) 08/07/2014    mLAD & D2 100% - PCI to LAD w/ Promus P DES 2.25 x 20 & 8 (2.4), D2 Med Rx; Cx-OM tandem 80% -> PCI Promus P DES 2.75 x 16 & 3.0 x 24 (3 mm distal & 3.25 mm prox)  . Cardiomyopathy, ischemic-EF 40% by echo  09/01/2014    Echo: 8/30: Moderate LVH, EF roughly 40%. Pseudo-normal Gr2 DD, apical akinesis, moderate HK of apical inferior mid inferolateral and anterolateral as well as apical septal and apical lateral walls with mild HK of apical anterior   . Hx of stroke February 17 2010  . Essential hypertension, benign   . Dyslipidemia with high LDL and low HDL   . 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 Diabetic neuropathy associated with type 2 diabetes mellitus   . GERD (gastroesophageal reflux disease)   . Osteoarthritis of back     With chronic low back pain    History   Social History  . Marital Status: Married    Spouse Name: N/A    Number of Children: N/A  . Years of Education: N/A   Occupational History  . Not on file.   Social History Main Topics  . Smoking status: Never Smoker   . Smokeless tobacco: Not on file  . Alcohol Use: No  . Drug Use: No  . Sexual Activity: Yes   Other Topics Concern  . Not on file   Social History Narrative   Married. Does not smoke  -- never smoked.   He is currently doing cardiac rehabilitation    Past Surgical History  Procedure Laterality Date  . Percutaneous coronary stent intervention (pci-s)  08/07/2014     PCI-mCx-OM2 tandem 80% - Promus P DES 2.75 mm x 16 mm and 3.0 mm x 24 mm (3 mm distal & 3.25 mm prox); Mid LAD 100% - 2 Overlapping Promus P DES 2.25 mm x 20 mm & 2.25 mm x 8 mm (2.4 mm) only TIMI2 apical flow, Occluded D2 (not intervened on)  . Transthoracic echocardiogram  07/13/2014    Moderate LVH, EF roughly 40%. Pseudo-normal Gr2 DD, apical akinesis, moderate HK of apical inferior mid inferolateral and anterolateral as well as apical septal and apical lateral walls with mild HK of apical anterior    Family History  Problem Relation Age of Onset  . Cancer Father   . Diabetes Neg Hx     No Known Allergies  Current Outpatient Prescriptions on File Prior to Visit  Medication Sig Dispense Refill  . acetaminophen (TYLENOL) 500 MG tablet Take 1,000 mg by mouth every 6 (six) hours as needed for moderate pain.      .Marland Kitchen  aspirin 81 MG chewable tablet Chew 1 tablet (81 mg total) by mouth daily.      Marland Kitchen atorvastatin (LIPITOR) 40 MG tablet Take 1 tablet (40 mg total) by mouth daily at 6 PM.  30 tablet  5  . carvedilol (COREG) 12.5 MG tablet Take 1 tablet (12.5 mg total) by mouth 2 (two) times daily.  60 tablet  11  . gabapentin (NEURONTIN) 600 MG tablet Take 600 mg by mouth 2 (two) times daily.      Marland Kitchen glucose blood (RELION GLUCOSE TEST STRIPS) test strip 1 each by Other route 2 (two) times daily. And lancets 2/day 250.01  100 each  12  . Insulin Glargine (TOUJEO SOLOSTAR) 300 UNIT/ML SOPN Inject 70 Units into the skin every morning. And pen needles 1/day  10 pen  11  . lisinopril (PRINIVIL,ZESTRIL) 20 MG tablet Take 10 mg by mouth daily.      . nitroGLYCERIN (NITROSTAT) 0.4 MG SL tablet Place 1 tablet (0.4 mg total) under the tongue every 5 (five) minutes as needed for chest pain.  25 tablet  2  . ranitidine  (ZANTAC) 75 MG tablet Take 75 mg by mouth 2 (two) times daily as needed for heartburn.       . ticagrelor (BRILINTA) 90 MG TABS tablet Take 1 tablet (90 mg total) by mouth 2 (two) times daily.  60 tablet  10   No current facility-administered medications on file prior to visit.    BP 130/80  Pulse 86  Temp(Src) 98.2 F (36.8 C) (Oral)  Wt 233 lb 3.2 oz (105.779 kg)  SpO2 98%      Review of Systems  Constitutional: Negative for fever, chills and fatigue.  HENT: Negative.   Respiratory: Negative for cough, chest tightness, shortness of breath and wheezing.   Cardiovascular: Negative for chest pain and palpitations.  Gastrointestinal: Negative.   Genitourinary: Negative.   Musculoskeletal: Negative.   Skin:       Lt pretibial/tuberosity region wound is healing very well.  Neurological: Negative for dizziness, tremors, seizures, syncope, facial asymmetry, speech difficulty, weakness, light-headedness, numbness and headaches.  Hematological: Negative for adenopathy. Does not bruise/bleed easily.       Objective:   Physical Exam  General Mental Status- Alert. General Appearance- Not in acute distress.   Skin General: Color- Normal Color. Moisture- Normal Moisture. Area on left pretibial region shows only 9 mm small ulcer. Very shallow ulcer.  surrounding tissue finally healing.  Neck Carotid Arteries- Normal color. Moisture- Normal Moisture. No carotid bruits. No JVD.  Chest and Lung Exam Auscultation: Breath Sounds:-Normal.CTA  Cardiovascular Auscultation:Rythm- Regular. rate and rythm Murmurs & Other Heart Sounds:Auscultation of the heart reveals- No Murmurs.  Abdomen Inspection:-Inspeection Normal. Palpation/Percussion:Note:No mass. Palpation and Percussion of the abdomen reveal- Non Tender, Non Distended + BS, no rebound or guarding.  Neurologic Cranial Nerve exam:- CN III-XII intact(No nystagmus), symmetric smile. Strength:- 5/5 equal and symmetric strength  both upper and lower extremities.        Assessment & Plan:

## 2014-10-02 NOTE — Assessment & Plan Note (Signed)
Continue carvedilol and lisinopril. Refilled his lisinopril today.

## 2014-10-02 NOTE — Assessment & Plan Note (Signed)
Pt is seeing endocrinologist. He is about to start toujeo. He wanted my opinion and I encouraged him to follow advise of specialist.

## 2014-10-08 DIAGNOSIS — L03116 Cellulitis of left lower limb: Secondary | ICD-10-CM | POA: Diagnosis not present

## 2014-10-13 ENCOUNTER — Telehealth: Payer: Self-pay | Admitting: Endocrinology

## 2014-10-13 ENCOUNTER — Ambulatory Visit: Payer: BC Managed Care – PPO | Admitting: Endocrinology

## 2014-10-13 NOTE — Telephone Encounter (Signed)
See below

## 2014-10-13 NOTE — Telephone Encounter (Signed)
Patient could not keep his appointment today  He did want Dr. Everardo All to know that the Nathen May is working well and sugars are under 200   Thank you

## 2014-10-15 ENCOUNTER — Encounter (HOSPITAL_BASED_OUTPATIENT_CLINIC_OR_DEPARTMENT_OTHER): Payer: BC Managed Care – PPO | Attending: Internal Medicine

## 2014-10-15 DIAGNOSIS — L97929 Non-pressure chronic ulcer of unspecified part of left lower leg with unspecified severity: Secondary | ICD-10-CM | POA: Insufficient documentation

## 2014-11-19 ENCOUNTER — Encounter (HOSPITAL_COMMUNITY): Payer: Self-pay | Admitting: Cardiology

## 2014-11-26 ENCOUNTER — Ambulatory Visit (INDEPENDENT_AMBULATORY_CARE_PROVIDER_SITE_OTHER): Payer: BC Managed Care – PPO | Admitting: Medical

## 2014-11-26 ENCOUNTER — Encounter: Payer: Self-pay | Admitting: Medical

## 2014-11-26 VITALS — BP 165/105 | HR 85 | Temp 98.2°F | Ht 71.0 in | Wt 241.0 lb

## 2014-11-26 DIAGNOSIS — T7840XA Allergy, unspecified, initial encounter: Secondary | ICD-10-CM | POA: Insufficient documentation

## 2014-11-26 DIAGNOSIS — I10 Essential (primary) hypertension: Secondary | ICD-10-CM

## 2014-11-26 MED ORDER — HYDROXYZINE HCL 25 MG PO TABS: 25.0000 mg | ORAL_TABLET | Freq: Three times a day (TID) | ORAL | Status: DC | PRN

## 2014-11-26 MED ORDER — BETAMETHASONE DIPROPIONATE 0.05 % EX CREA: TOPICAL_CREAM | Freq: Two times a day (BID) | CUTANEOUS | Status: DC

## 2014-11-26 NOTE — Assessment & Plan Note (Signed)
For you blood pressure(which is not at ideal level but rather moderate elevated). I want you to take to coreg 25 mg twice daily and continue the lisinopril same dose.  Follow up in 10 days for bp check  or as needed.

## 2014-11-26 NOTE — Assessment & Plan Note (Signed)
For your allergic reaction, I am going to give our diprolene ointment to apply to area twice daily and hydroxyzine for the itching.  Counseled pt that oral prednisone not recommended since he is diabetic and particularly since his a1-c was 13.7 in September.

## 2014-11-26 NOTE — Progress Notes (Signed)
Pre visit review using our clinic review tool, if applicable. No additional management support is needed unless otherwise documented below in the visit note. 

## 2014-11-26 NOTE — Patient Instructions (Addendum)
For your allergic reaction, I am going to give our diprolene ointment to apply to area twice daily and hydroxyzine for the itching.  For you blood pressure(which is not at ideal level but rather moderate elevated). I want you to take to coreg 25 mg twice daily and continue the lisinopril same dose.  Follow up in 10 days for bp check  or as needed.

## 2014-11-26 NOTE — Progress Notes (Signed)
Subjective:    Patient ID: Jonathan Clayton, male    DOB: 10-Sep-1966, 48 y.o.   MRN: [id number]  HPI   Pt in states that he has rash on both forearms. Itching since then. He is using calamine lotion but not improving. Pt was pretty sure got into poison ivy or oak. He was cleaning up back yard brush. Rash present for about 5 days.  Pt tried nothing else for this other than calamine.  Pt has not been checking his bs in a while. Pt last a1-c was 13.7.  His bp is up. He is taking his bp medication. No skipped doses. No neurologic or cardiac signs or symptoms.  Past Medical History  Diagnosis Date  . STEMI of inferolateral wall- 08/07/14 08/07/2014  . CAD S/P PCI mLAD & Cx-OM (Promus DES) 08/07/2014    mLAD & D2 100% - PCI to LAD w/ Promus P DES 2.25 x 20 & 8 (2.4), D2 Med Rx; Cx-OM tandem 80% -> PCI Promus P DES 2.75 x 16 & 3.0 x 24 (3 mm distal & 3.25 mm prox)  . Cardiomyopathy, ischemic-EF 40% by echo  09/01/2014    Echo: 8/30: Moderate LVH, EF roughly 40%. Pseudo-normal Gr2 DD, apical akinesis, moderate HK of apical inferior mid inferolateral and anterolateral as well as apical septal and apical lateral walls with mild HK of apical anterior   . Hx of stroke February 17 2010  . Essential hypertension, benign   . Dyslipidemia with high LDL and low HDL   . 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 Diabetic neuropathy associated with type 2 diabetes mellitus   . GERD (gastroesophageal reflux disease)   . Osteoarthritis of back     With chronic low back pain    History   Social History  . Marital Status: Married    Spouse Name: N/A    Number of Children: N/A  . Years of Education: N/A   Occupational History  . Not on file.   Social History Main Topics  . Smoking status: Never Smoker   . Smokeless tobacco: Not on file  . Alcohol Use: No  . Drug Use: No  . Sexual Activity: Yes   Other Topics Concern  . Not on file   Social History Narrative   Married. Does not smoke --  never smoked.   He is currently doing cardiac rehabilitation    Past Surgical History  Procedure Laterality Date  . Percutaneous coronary stent intervention (pci-s)  08/07/2014     PCI-mCx-OM2 tandem 80% - Promus P DES 2.75 mm x 16 mm and 3.0 mm x 24 mm (3 mm distal & 3.25 mm prox); Mid LAD 100% - 2 Overlapping Promus P DES 2.25 mm x 20 mm & 2.25 mm x 8 mm (2.4 mm) only TIMI2 apical flow, Occluded D2 (not intervened on)  . Transthoracic echocardiogram  07/13/2014    Moderate LVH, EF roughly 40%. Pseudo-normal Gr2 DD, apical akinesis, moderate HK of apical inferior mid inferolateral and anterolateral as well as apical septal and apical lateral walls with mild HK of apical anterior  . Left heart catheterization with coronary angiogram N/A 08/07/2014    Procedure: LEFT HEART CATHETERIZATION WITH CORONARY ANGIOGRAM;  Surgeon: Marykay Lex, MD;  Location: [facility] CATH LAB;  Service: Cardiovascular;  Laterality: N/A;    Family History  Problem Relation Age of Onset  . Cancer Father   . Diabetes Neg Hx     No Known Allergies  Current Outpatient Prescriptions on File Prior to Visit  Medication Sig Dispense Refill  . acetaminophen (TYLENOL) 500 MG tablet Take 1,000 mg by mouth every 6 (six) hours as needed for moderate pain.    Marland Kitchen. aspirin 81 MG chewable tablet Chew 1 tablet (81 mg total) by mouth daily.    Marland Kitchen. atorvastatin (LIPITOR) 40 MG tablet Take 1 tablet (40 mg total) by mouth daily at 6 PM. 30 tablet 5  . carvedilol (COREG) 12.5 MG tablet Take 1 tablet (12.5 mg total) by mouth 2 (two) times daily. 60 tablet 11  . gabapentin (NEURONTIN) 600 MG tablet Take 600 mg by mouth 2 (two) times daily.    Marland Kitchen. glucose blood (RELION GLUCOSE TEST STRIPS) test strip 1 each by Other route 2 (two) times daily. And lancets 2/day 250.01 100 each 12  . Insulin Glargine (TOUJEO SOLOSTAR) 300 UNIT/ML SOPN Inject 70 Units into the skin every morning. And pen needles 1/day 10 pen 11  . lisinopril (PRINIVIL,ZESTRIL) 20 MG  tablet Take 10 mg by mouth daily.    . nitroGLYCERIN (NITROSTAT) 0.4 MG SL tablet Place 1 tablet (0.4 mg total) under the tongue every 5 (five) minutes as needed for chest pain. 25 tablet 2  . ranitidine (ZANTAC) 75 MG tablet Take 75 mg by mouth 2 (two) times daily as needed for heartburn.     . ticagrelor (BRILINTA) 90 MG TABS tablet Take 1 tablet (90 mg total) by mouth 2 (two) times daily. 60 tablet 10   No current facility-administered medications on file prior to visit.    BP 165/105 mmHg  Pulse 85  Temp(Src) 98.2 F (36.8 C) (Oral)  Ht 5\' 11"  (1.803 m)  Wt 241 lb (109.317 kg)  BMI 33.63 kg/m2  SpO2 96%      Review of Systems  Constitutional: Negative for fever, chills and fatigue.  Respiratory: Negative for cough, chest tightness and shortness of breath.   Cardiovascular: Negative for chest pain, palpitations and leg swelling.  Gastrointestinal: Negative for nausea, vomiting and abdominal pain.  Musculoskeletal: Negative for neck pain and neck stiffness.  Skin: Positive for rash.       Rash that itches.  Neurological: Negative for dizziness, tremors, seizures, syncope, facial asymmetry, speech difficulty, weakness, light-headedness, numbness and headaches.  Psychiatric/Behavioral: Negative for behavioral problems, confusion and agitation. The patient is not nervous/anxious.        Objective:   Physical Exam  General Mental Status- Alert. General Appearance- Not in acute distress.   Skin General: Color- Normal Color. Moisture- Normal Moisture.  Neck Carotid Arteries- Normal color. Moisture- Normal Moisture. No carotid bruits. No JVD.  Chest and Lung Exam Auscultation: Breath Sounds:-Normal.  Cardiovascular Auscultation:Rythm- Regular. Murmurs & Other Heart Sounds:Auscultation of the heart reveals- No Murmurs.  Abdomen Inspection:-Inspeection Normal. Palpation/Percussion:Note:No mass. Palpation and Percussion of the abdomen reveal- Non Tender, Non Distended  + BS, no rebound or guarding.    Neurologic Cranial Nerve exam:- CN III-XII intact(No nystagmus), symmetric smile. Drift Test:- No drift. Romberg Exam:- Negative.  Heal to Toe Gait exam:-Normal. Finger to Nose:- Normal/Intact Strength:- 5/5 equal and symmetric strength both upper and lower extremities.  Skin- 3 areas of linear papular rash on both/each  forearm. Covered in calamine lotion. No redness, warmth, swelling or tenderness.      Assessment & Plan:

## 2014-12-01 ENCOUNTER — Ambulatory Visit: Payer: BC Managed Care – PPO | Admitting: Cardiology

## 2014-12-07 ENCOUNTER — Encounter: Payer: Self-pay | Admitting: Medical

## 2014-12-07 ENCOUNTER — Ambulatory Visit (INDEPENDENT_AMBULATORY_CARE_PROVIDER_SITE_OTHER): Payer: BC Managed Care – PPO | Admitting: Medical

## 2014-12-07 VITALS — BP 121/82 | HR 79 | Temp 98.8°F | Ht 71.0 in | Wt 237.4 lb

## 2014-12-07 DIAGNOSIS — I10 Essential (primary) hypertension: Secondary | ICD-10-CM

## 2014-12-07 DIAGNOSIS — T7840XD Allergy, unspecified, subsequent encounter: Secondary | ICD-10-CM

## 2014-12-07 DIAGNOSIS — R0600 Dyspnea, unspecified: Secondary | ICD-10-CM

## 2014-12-07 MED ORDER — BETAMETHASONE DIPROPIONATE 0.05 % EX CREA: TOPICAL_CREAM | Freq: Two times a day (BID) | CUTANEOUS | Status: DC

## 2014-12-07 NOTE — Assessment & Plan Note (Signed)
Pt rash is much improved. I am going to refill your diprolene.(instructions how to use in future and contraindications for use)

## 2014-12-07 NOTE — Assessment & Plan Note (Signed)
Regarding you transient shortness of breath lying down I offered cxr but you declined. Note if last prolonged period or if needed numerous pillows to relief shortness of breath then would needecxr . Also not if any swelling of legs would indicate  cxr as well

## 2014-12-07 NOTE — Progress Notes (Signed)
Subjective:    Patient ID: Jonathan Clayton, male    DOB: [birth year], 48 y.o.   MRN: [id number]  HPI  Pt in for follow for follow up. The diprolene cream and it helped a lot. About 95 % better.  Pt bp is a lot better as well. No headaches and no chest pain. Pt did make changes of coreg 25 mg bid. He kept same dose of lisinopril.   Pt mentioned at end very transient rare occasional sob lying on his bed. Last for seconds then subsides. No associated wheezing, chest  Pain, popliteal pain. Or pedal edema.     Past Medical History  Diagnosis Date  . STEMI of inferolateral wall- 08/07/14 08/07/2014  . CAD S/P PCI mLAD & Cx-OM (Promus DES) 08/07/2014    mLAD & D2 100% - PCI to LAD w/ Promus P DES 2.25 x 20 & 8 (2.4), D2 Med Rx; Cx-OM tandem 80% -> PCI Promus P DES 2.75 x 16 & 3.0 x 24 (3 mm distal & 3.25 mm prox)  . Cardiomyopathy, ischemic-EF 40% by echo  09/01/2014    Echo: 8/30: Moderate LVH, EF roughly 40%. Pseudo-normal Gr2 DD, apical akinesis, moderate HK of apical inferior mid inferolateral and anterolateral as well as apical septal and apical lateral walls with mild HK of apical anterior   . Hx of stroke February 17 2010  . Essential hypertension, benign   . Dyslipidemia with high LDL and low HDL   . 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 Diabetic neuropathy associated with type 2 diabetes mellitus   . GERD (gastroesophageal reflux disease)   . Osteoarthritis of back     With chronic low back pain    History   Social History  . Marital Status: Married    Spouse Name: N/A    Number of Children: N/A  . Years of Education: N/A   Occupational History  . Not on file.   Social History Main Topics  . Smoking status: Never Smoker   . Smokeless tobacco: Not on file  . Alcohol Use: No  . Drug Use: No  . Sexual Activity: Yes   Other Topics Concern  . Not on file   Social History Narrative   Married. Does not smoke -- never smoked.   He is currently doing cardiac  rehabilitation    Past Surgical History  Procedure Laterality Date  . Percutaneous coronary stent intervention (pci-s)  08/07/2014     PCI-mCx-OM2 tandem 80% - Promus P DES 2.75 mm x 16 mm and 3.0 mm x 24 mm (3 mm distal & 3.25 mm prox); Mid LAD 100% - 2 Overlapping Promus P DES 2.25 mm x 20 mm & 2.25 mm x 8 mm (2.4 mm) only TIMI2 apical flow, Occluded D2 (not intervened on)  . Transthoracic echocardiogram  07/13/2014    Moderate LVH, EF roughly 40%. Pseudo-normal Gr2 DD, apical akinesis, moderate HK of apical inferior mid inferolateral and anterolateral as well as apical septal and apical lateral walls with mild HK of apical anterior  . Left heart catheterization with coronary angiogram N/A 08/07/2014    Procedure: LEFT HEART CATHETERIZATION WITH CORONARY ANGIOGRAM;  Surgeon: Marykay Lexavid W Harding, MD;  [facility];  Service: Cardiovascular;  Laterality: N/A;    Family History  Problem Relation Age of Onset  . Cancer Father   . Diabetes Neg Hx     No Known Allergies  Current Outpatient Prescriptions on File Prior to Visit  Medication Sig Dispense Refill  . acetaminophen (TYLENOL) 500 MG tablet Take 1,000 mg by mouth every 6 (six) hours as needed for moderate pain.    Marland Kitchen aspirin 81 MG chewable tablet Chew 1 tablet (81 mg total) by mouth daily.    Marland Kitchen atorvastatin (LIPITOR) 40 MG tablet Take 1 tablet (40 mg total) by mouth daily at 6 PM. 30 tablet 5  . betamethasone dipropionate (DIPROLENE) 0.05 % cream Apply topically 2 (two) times daily. 30 g 0  . carvedilol (COREG) 12.5 MG tablet Take 1 tablet (12.5 mg total) by mouth 2 (two) times daily. 60 tablet 11  . gabapentin (NEURONTIN) 600 MG tablet Take 600 mg by mouth 2 (two) times daily.    Marland Kitchen glucose blood (RELION GLUCOSE TEST STRIPS) test strip 1 each by Other route 2 (two) times daily. And lancets 2/day 250.01 100 each 12  . hydrOXYzine (ATARAX/VISTARIL) 25 MG tablet Take 1 tablet (25 mg total) by mouth every 8 (eight) hours as needed for  itching. 30 tablet 0  . Insulin Glargine (TOUJEO SOLOSTAR) 300 UNIT/ML SOPN Inject 70 Units into the skin every morning. And pen needles 1/day 10 pen 11  . lisinopril (PRINIVIL,ZESTRIL) 20 MG tablet Take 10 mg by mouth daily.    . nitroGLYCERIN (NITROSTAT) 0.4 MG SL tablet Place 1 tablet (0.4 mg total) under the tongue every 5 (five) minutes as needed for chest pain. 25 tablet 2  . ranitidine (ZANTAC) 75 MG tablet Take 75 mg by mouth 2 (two) times daily as needed for heartburn.     . ticagrelor (BRILINTA) 90 MG TABS tablet Take 1 tablet (90 mg total) by mouth 2 (two) times daily. 60 tablet 10   No current facility-administered medications on file prior to visit.    BP 121/82 mmHg  Pulse 79  Temp(Src) 98.8 F (37.1 C) (Oral)  Ht 5\' 11"  (1.803 m)  Wt 237 lb 6.4 oz (107.684 kg)  BMI 33.13 kg/m2  SpO2 95%       Review of Systems  Constitutional: Negative for fever, chills and fatigue.  Respiratory: Positive for shortness of breath. Negative for cough, choking and wheezing.        Rare and transient seconds lying supine. No cardiac and no associated dvt or chf symptoms presently.  Cardiovascular: Negative for chest pain and palpitations.  Gastrointestinal: Negative.   Endocrine: Negative for polydipsia, polyphagia and polyuria.  Genitourinary: Negative for dysuria, frequency and flank pain.  Musculoskeletal: Negative for myalgias, back pain, joint swelling and neck stiffness.  Skin: Negative for rash.       Rash looks abot 95 % better.  Neurological: Negative for dizziness, syncope, speech difficulty, weakness, light-headedness, numbness and headaches.  Hematological: Negative for adenopathy. Does not bruise/bleed easily.       Objective:   Physical Exam  General- No acute distress. Pleasant patient. Neck- Full range of motion, no jvd Lungs- Clear, even and unlabored. Heart- regular rate and rhythm. Neurologic- CNII- XII grossly intact. Skin- minimal residual rash on both  forearms. And one over left flank. These areas look like scarring at this point.        Assessment & Plan:

## 2014-12-07 NOTE — Progress Notes (Signed)
Pre visit review using our clinic review tool, if applicable. No additional management support is needed unless otherwise documented below in the visit note. 

## 2014-12-07 NOTE — Patient Instructions (Addendum)
Pt rash is much improved. I am going to refill your diprolene.(instructions how to use in future and contraindications for use)  For your blood pressure continue with the coreg 25 twice daily and lisinopril 20 mg a day.  Regarding you transient shortness of breath lying down I offered cxr but you declined. Note if last prolonged period or if needed numerous pillows to relief shortness of breath then would needecxr . Also not if any swelling of legs would indicate  cxr as well.  Follow up 3 months or as needed.

## 2014-12-07 NOTE — Assessment & Plan Note (Signed)
For your blood pressure continue with the coreg 25 twice daily and lisinopril 20 mg a day.

## 2014-12-16 ENCOUNTER — Ambulatory Visit (INDEPENDENT_AMBULATORY_CARE_PROVIDER_SITE_OTHER): Payer: BLUE CROSS/BLUE SHIELD | Admitting: Endocrinology

## 2014-12-16 ENCOUNTER — Encounter: Payer: Self-pay | Admitting: Endocrinology

## 2014-12-16 VITALS — BP 128/88 | HR 74 | Temp 97.7°F | Wt 239.0 lb

## 2014-12-16 DIAGNOSIS — E1122 Type 2 diabetes mellitus with diabetic chronic kidney disease: Secondary | ICD-10-CM

## 2014-12-16 DIAGNOSIS — N189 Chronic kidney disease, unspecified: Secondary | ICD-10-CM

## 2014-12-16 LAB — MICROALBUMIN / CREATININE URINE RATIO
CREATININE, U: 201.8 mg/dL
Microalb Creat Ratio: 1.2 mg/g (ref 0.0–30.0)
Microalb, Ur: 2.5 mg/dL — ABNORMAL HIGH (ref 0.0–1.9)

## 2014-12-16 LAB — HEMOGLOBIN A1C: HEMOGLOBIN A1C: 11.9 % — AB (ref 4.6–6.5)

## 2014-12-16 NOTE — Patient Instructions (Addendum)
check your blood sugar twice a day.  vary the time of day when you check, between before the 3 meals, and at bedtime.  also check if you have symptoms of your blood sugar being too high or too low.  please keep a record of the readings and bring it to your next appointment here.  You can write it on any piece of paper.  please call us sooner if your blood sugar goes below 70, or if you have a lot of readings over 200.     blood and urine tests are being requested for you today.  We'll let you know about the results. Please come back for a follow-up appointment in 2 weeks.  Please continue to try to lose weight, as losing helps your blood sugar.

## 2014-12-16 NOTE — Progress Notes (Signed)
Subjective:    Patient ID: Jonathan Clayton, male    DOB: [birth year], 49 y.o.   MRN: [id number]  HPI  Pt returns for f/u of diabetes mellitus: DM type: Insulin-requiring type 2 Dx'ed: 2000 Complications: painful neuropathy of the lower extremities, CAD, and renal insufficiency Therapy: insulin since 2010 DKA: never Severe hypoglycemia: never Pancreatitis: never Other: due to h/o noncompliance, he is on qd insulin.  Interval history: no cbg record, but states cbg's vary from 125-200.  There is no trend throughout the day.  pt states he feels well in general.  He says he misses the insulin only once per month.   Past Medical History  Diagnosis Date  . STEMI of inferolateral wall- 08/07/14 08/07/2014  . CAD S/P PCI mLAD & Cx-OM (Promus DES) 08/07/2014    mLAD & D2 100% - PCI to LAD w/ Promus P DES 2.25 x 20 & 8 (2.4), D2 Med Rx; Cx-OM tandem 80% -> PCI Promus P DES 2.75 x 16 & 3.0 x 24 (3 mm distal & 3.25 mm prox)  . Cardiomyopathy, ischemic-EF 40% by echo  09/01/2014    Echo: 8/30: Moderate LVH, EF roughly 40%. Pseudo-normal Gr2 DD, apical akinesis, moderate HK of apical inferior mid inferolateral and anterolateral as well as apical septal and apical lateral walls with mild HK of apical anterior   . Hx of stroke February 17 2010  . Essential hypertension, benign   . Dyslipidemia with high LDL and low HDL   . 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 Diabetic neuropathy associated with type 2 diabetes mellitus   . GERD (gastroesophageal reflux disease)   . Osteoarthritis of back     With chronic low back pain    Past Surgical History  Procedure Laterality Date  . Percutaneous coronary stent intervention (pci-s)  08/07/2014     PCI-mCx-OM2 tandem 80% - Promus P DES 2.75 mm x 16 mm and 3.0 mm x 24 mm (3 mm distal & 3.25 mm prox); Mid LAD 100% - 2 Overlapping Promus P DES 2.25 mm x 20 mm & 2.25 mm x 8 mm (2.4 mm) only TIMI2 apical flow, Occluded D2 (not intervened on)  . Transthoracic  echocardiogram  07/13/2014    Moderate LVH, EF roughly 40%. Pseudo-normal Gr2 DD, apical akinesis, moderate HK of apical inferior mid inferolateral and anterolateral as well as apical septal and apical lateral walls with mild HK of apical anterior  . Left heart catheterization with coronary angiogram N/A 08/07/2014    Procedure: LEFT HEART CATHETERIZATION WITH CORONARY ANGIOGRAM;  Surgeon: Marykay Lex, MD;  Location: [facility] CATH LAB;  Service: Cardiovascular;  Laterality: N/A;    History   Social History  . Marital Status: Married    Spouse Name: N/A    Number of Children: N/A  . Years of Education: N/A   Occupational History  . Not on file.   Social History Main Topics  . Smoking status: Never Smoker   . Smokeless tobacco: Not on file  . Alcohol Use: No  . Drug Use: No  . Sexual Activity: Yes   Other Topics Concern  . Not on file   Social History Narrative   Married. Does not smoke -- never smoked.   He is currently doing cardiac rehabilitation    Current Outpatient Prescriptions on File Prior to Visit  Medication Sig Dispense Refill  . aspirin 81 MG chewable tablet Chew 1 tablet (81 mg total) by mouth daily.    Marland Kitchen  atorvastatin (LIPITOR) 40 MG tablet Take 1 tablet (40 mg total) by mouth daily at 6 PM. 30 tablet 5  . carvedilol (COREG) 12.5 MG tablet Take 1 tablet (12.5 mg total) by mouth 2 (two) times daily. 60 tablet 11  . gabapentin (NEURONTIN) 600 MG tablet Take 600 mg by mouth 2 (two) times daily.    . Insulin Glargine (TOUJEO SOLOSTAR) 300 UNIT/ML SOPN Inject 70 Units into the skin every morning. And pen needles 1/day (Patient taking differently: Inject 90 Units into the skin every morning. And pen needles 1/day) 10 pen 11  . lisinopril (PRINIVIL,ZESTRIL) 20 MG tablet Take 10 mg by mouth daily.    . nitroGLYCERIN (NITROSTAT) 0.4 MG SL tablet Place 1 tablet (0.4 mg total) under the tongue every 5 (five) minutes as needed for chest pain. 25 tablet 2  . ranitidine (ZANTAC)  75 MG tablet Take 75 mg by mouth 2 (two) times daily as needed for heartburn.     . ticagrelor (BRILINTA) 90 MG TABS tablet Take 1 tablet (90 mg total) by mouth 2 (two) times daily. 60 tablet 10  . acetaminophen (TYLENOL) 500 MG tablet Take 1,000 mg by mouth every 6 (six) hours as needed for moderate pain.    Marland Kitchen betamethasone dipropionate (DIPROLENE) 0.05 % cream Apply topically 2 (two) times daily. (Patient not taking: Reported on 12/16/2014) 30 g 0  . glucose blood (RELION GLUCOSE TEST STRIPS) test strip 1 each by Other route 2 (two) times daily. And lancets 2/day 250.01 (Patient not taking: Reported on 12/16/2014) 100 each 12  . hydrOXYzine (ATARAX/VISTARIL) 25 MG tablet Take 1 tablet (25 mg total) by mouth every 8 (eight) hours as needed for itching. (Patient not taking: Reported on 12/16/2014) 30 tablet 0   No current facility-administered medications on file prior to visit.    No Known Allergies  Family History  Problem Relation Age of Onset  . Cancer Father   . Diabetes Neg Hx     BP 128/88 mmHg  Pulse 74  Temp(Src) 97.7 F (36.5 C) (Oral)  Wt 239 lb (108.41 kg)  SpO2 96%    Review of Systems He denies hypoglycemia.  He has gained weight.      Objective:   Physical Exam VITAL SIGNS:  See vs page GENERAL: no distress Pulses: dorsalis pedis intact bilat, but decreased from normal.    MSK: no deformity of the feet CV: no leg edema.   Skin:  no ulcer on the feet.  normal color and temp on the feet. Neuro: sensation is intact to touch on the feet, but decreased from normal   Lab Results  Component Value Date   HGBA1C 11.9* 12/16/2014       Assessment & Plan:  DM: severe exacerbation Weight gain: this complicates the rx of DM.   Noncompliance with cbg recording: I'll work around this as best I can   Patient is advised the following: Patient Instructions  check your blood sugar twice a day.  vary the time of day when you check, between before the 3 meals, and at  bedtime.  also check if you have symptoms of your blood sugar being too high or too low.  please keep a record of the readings and bring it to your next appointment here.  You can write it on any piece of paper.  please call us sooner if your blood sugar goes below 70, or if you have a lot of readings over 200.     blood  and urine tests are being requested for you today.  We'll let you know about the results. Please come back for a follow-up appointment in 2 weeks.  Please continue to try to lose weight, as losing helps your blood sugar.

## 2015-01-04 ENCOUNTER — Ambulatory Visit: Payer: BC Managed Care – PPO | Admitting: Medical

## 2015-01-20 ENCOUNTER — Encounter: Payer: Self-pay | Admitting: Cardiology

## 2015-01-20 ENCOUNTER — Ambulatory Visit (INDEPENDENT_AMBULATORY_CARE_PROVIDER_SITE_OTHER): Payer: BLUE CROSS/BLUE SHIELD | Admitting: Cardiology

## 2015-01-20 VITALS — BP 124/64 | HR 88 | Ht 71.0 in | Wt 232.8 lb

## 2015-01-20 DIAGNOSIS — I255 Ischemic cardiomyopathy: Secondary | ICD-10-CM

## 2015-01-20 DIAGNOSIS — R0600 Dyspnea, unspecified: Secondary | ICD-10-CM

## 2015-01-20 DIAGNOSIS — E784 Other hyperlipidemia: Secondary | ICD-10-CM

## 2015-01-20 DIAGNOSIS — Z79899 Other long term (current) drug therapy: Secondary | ICD-10-CM

## 2015-01-20 DIAGNOSIS — I2119 ST elevation (STEMI) myocardial infarction involving other coronary artery of inferior wall: Secondary | ICD-10-CM

## 2015-01-20 DIAGNOSIS — I10 Essential (primary) hypertension: Secondary | ICD-10-CM

## 2015-01-20 DIAGNOSIS — Z9861 Coronary angioplasty status: Secondary | ICD-10-CM

## 2015-01-20 DIAGNOSIS — E669 Obesity, unspecified: Secondary | ICD-10-CM

## 2015-01-20 DIAGNOSIS — E785 Hyperlipidemia, unspecified: Secondary | ICD-10-CM

## 2015-01-20 DIAGNOSIS — I2510 Atherosclerotic heart disease of native coronary artery without angina pectoris: Secondary | ICD-10-CM

## 2015-01-20 MED ORDER — NEBIVOLOL HCL 10 MG PO TABS: 10.0000 mg | ORAL_TABLET | Freq: Every day | ORAL | Status: DC

## 2015-01-20 NOTE — Patient Instructions (Signed)
TAKE BYSTOLIC 10 MG DAILY UNTIL YOU ARE ABLE TO PURCHASE CARVEDIOL  March 2016--\Your physician has requested that you have an echocardiogram. Echocardiography is a painless test that uses sound waves to create images of your heart. It provides your doctor with information about the size and shape of your heart and how well your heart's chambers and valves are working. This procedure takes approximately one hour. There are no restrictions for this procedure.  Your physician wants you to follow-up in 6 month Dr Herbie Baltimore.  You will receive a reminder letter in the mail two months in advance. If you don't receive a letter, please call our office to schedule the follow-up appointment.

## 2015-01-22 NOTE — Assessment & Plan Note (Signed)
Actually relatively well controlled considering all his ACE inhibitor. Restart ACE inhibitor as soon as he gets to medication refill.

## 2015-01-22 NOTE — Assessment & Plan Note (Addendum)
Continue statin.  Check f/u Lipid Panel. If atorvastatin generic is too expensive we need to get him on something. Perhaps the samples intermittently

## 2015-01-22 NOTE — Assessment & Plan Note (Signed)
Borderline heart failure symptoms. We need to recheck his echo. Distal evidence of increased atrial pressures, I would consider starting Lasix. He needs to get back on his ACE inhibitor and continue carvedilol. 1 [ACE inhibitor should probably start at half a dose of (5 mg).

## 2015-01-22 NOTE — Progress Notes (Signed)
PCP: Esperanza Richters, PA-C  Clinic Note: Chief Complaint  Patient presents with  . 4 MONTH VISIT    HPI: Jonathan Clayton is a 49 y.o. male with a PMH below who presents today for 4 month visits following his MI with PCI. He was found to have moderate ischemic cardiomyopathy by echocardiogram..  Past Medical History  Diagnosis Date  . STEMI of inferolateral wall- 08/07/14 08/07/2014  . CAD S/P PCI mLAD & Cx-OM (Promus DES) 08/07/2014    mLAD & D2 100% - PCI to LAD w/ Promus P DES 2.25 x 20 & 8 (2.4), D2 Med Rx; Cx-OM tandem 80% -> PCI Promus P DES 2.75 x 16 & 3.0 x 24 (3 mm distal & 3.25 mm prox)  . Cardiomyopathy, ischemic-EF 40% by echo  09/01/2014    Echo: 8/30: Moderate LVH, EF roughly 40%. Pseudo-normal Gr2 DD, apical akinesis, moderate HK of apical inferior mid inferolateral and anterolateral as well as apical septal and apical lateral walls with mild HK of apical anterior   . Hx of stroke February 17 2010  . Essential hypertension, benign   . Dyslipidemia with high LDL and low HDL   . 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 Diabetic neuropathy associated with type 2 diabetes mellitus   . GERD (gastroesophageal reflux disease)   . Osteoarthritis of back     With chronic low back pain    Prior Cardiac Evaluation and Past Surgical History: Past Surgical History  Procedure Laterality Date  . Percutaneous coronary stent intervention (pci-s)  08/07/2014     PCI-mCx-OM2 tandem 80% - Promus P DES 2.75 mm x 16 mm and 3.0 mm x 24 mm (3 mm distal & 3.25 mm prox); Mid LAD 100% - 2 Overlapping Promus P DES 2.25 mm x 20 mm & 2.25 mm x 8 mm (2.4 mm) only TIMI2 apical flow, Occluded D2 (not intervened on)  . Transthoracic echocardiogram  07/13/2014    Moderate LVH, EF roughly 40%. Pseudo-normal Gr2 DD, apical akinesis, moderate HK of apical inferior mid inferolateral and anterolateral as well as apical septal and apical lateral walls with mild HK of apical anterior  . Left heart  catheterization with coronary angiogram N/A 08/07/2014    Procedure: LEFT HEART CATHETERIZATION WITH CORONARY ANGIOGRAM;  Surgeon: Marykay Lex, MD;  Location: [facility] CATH LAB;  Service: Cardiovascular;  Laterality: N/A;    Interval History: cervix as a day he saying that he still is trying to do activity doing yard work and cutting leaves. At doing so he needs to take a break as he is short of breath and his legs will hurt him.  He denies any chest tightness or pressure. Of course when he had his heart attack he came in for a spider bite on his leg. He does note some mild orthopnea but is adjacent to one pillow. Sometimes he has a sensation of PND which he wakes up short of breath. Every now and then he'll fill his heart going fluttering that lasts maybe a few seconds and not prolonged. No syncope or near-syncope. No TIAs or sounds he gets symptoms. He has not had resting or exertional chest pain but does get dyspneic.  He really hasn't noted much difference in the last week and has been off his ACE inhibitor. He has not had any bleeding complications from his Brilinta and is doing fine currently getting Brilinta. He also notes problems with erectile dysfunction. He has a hard time maintaining  erection.  ROS: A comprehensive was performed. Review of Systems  Respiratory: Positive for sputum production.   Cardiovascular: Positive for claudication (Claudication versus neuropathy pain).  Gastrointestinal: Negative for blood in stool and melena.  Genitourinary: Negative for hematuria.  Musculoskeletal: Positive for back pain and joint pain.  Neurological: Positive for dizziness. Negative for headaches.       Neuropathy pain in his feet  Psychiatric/Behavioral: The patient is nervous/anxious and has insomnia.   All other systems reviewed and are negative.   Current Outpatient Prescriptions on File Prior to Visit  Medication Sig Dispense Refill  . acetaminophen (TYLENOL) 500 MG tablet Take 1,000 mg  by mouth every 6 (six) hours as needed for moderate pain.    Marland Kitchen aspirin 81 MG chewable tablet Chew 1 tablet (81 mg total) by mouth daily.    Marland Kitchen atorvastatin (LIPITOR) 40 MG tablet Take 1 tablet (40 mg total) by mouth daily at 6 PM. 30 tablet 5  . betamethasone dipropionate (DIPROLENE) 0.05 % cream Apply topically 2 (two) times daily. 30 g 0  . gabapentin (NEURONTIN) 600 MG tablet Take 600 mg by mouth 2 (two) times daily.    Marland Kitchen glucose blood (RELION GLUCOSE TEST STRIPS) test strip 1 each by Other route 2 (two) times daily. And lancets 2/day 250.01 100 each 12  . hydrOXYzine (ATARAX/VISTARIL) 25 MG tablet Take 1 tablet (25 mg total) by mouth every 8 (eight) hours as needed for itching. 30 tablet 0  . Insulin Glargine (TOUJEO SOLOSTAR) 300 UNIT/ML SOPN Inject 70 Units into the skin every morning. And pen needles 1/day (Patient taking differently: Inject 90 Units into the skin every morning. And pen needles 1/day) 10 pen 11  . lisinopril (PRINIVIL,ZESTRIL) 20 MG tablet Take 10 mg by mouth daily.    . nitroGLYCERIN (NITROSTAT) 0.4 MG SL tablet Place 1 tablet (0.4 mg total) under the tongue every 5 (five) minutes as needed for chest pain. 25 tablet 2  . ranitidine (ZANTAC) 75 MG tablet Take 75 mg by mouth 2 (two) times daily as needed for heartburn.     . ticagrelor (BRILINTA) 90 MG TABS tablet Take 1 tablet (90 mg total) by mouth 2 (two) times daily. 60 tablet 10   No current facility-administered medications on file prior to visit.   No Known Allergies  SOCIAL AND FAMILY HISTORY REVIEWED IN EPIC -- No change; he remains unemployed/disabled. His wife is now reportedly also disabled. He is having a hard time getting his medications/49. He apparently has been out of his lisinopril for about 2 weeks now and is just run out of his Coreg.   Wt Readings from Last 3 Encounters:  01/20/15 232 lb 12.8 oz (105.597 kg)  12/16/14 239 lb (108.41 kg)  12/07/14 237 lb 6.4 oz (107.684 kg)    PHYSICAL EXAM BP  124/64 mmHg  Pulse 88  Ht  (1.803 m)  Wt 232 lb 12.8 oz (105.597 kg)  BMI 32.48 kg/m2 General appearance: alert, cooperative, appears stated age, no distress and mildly obese Neck: no adenopathy, no carotid bruit and no JVD Lungs: CTAB, normal percussion bilaterally and non-labored Heart: RRR, S1&S2 normal, soft S4 no murmur, click, rub; nondisplaced PMI Abdomen: soft, non-tender; bowel sounds normal; no masses, no organomegaly; mild truncal obesity  Extremities: extremities normal, atraumatic, no cyanosis, or edema Pulses: 2+ and symmetric;  Skin: normal and With the exception of the healing bug bite on his left calf.  Neurologic: Mental status: Alert, oriented, thought content appropriate Cranial  nerves: normal (II-XII grossly intact)    Adult ECG Report - Not checked  Recent Labs:  N/A   ASSESSMENT / PLAN: STEMI of inferolateral wall- 08/07/14 Somewhat surprising presentation is relatively late presentation. His symptoms were relatively strange. He still has a diagonal branch lesion the was not intervened on. He is not noticing exertional chest pressure but is noticing dyspnea. He had a drop in EF is due for a followup echocardiogram to reassess the EF and wall motion.   CAD S/P PCI mLAD & Cx-OM (Promus DES) As indicated above. He still is diagonal lesion. He was to be on a beta blocker which we will refill (I'm giving him some samples of bystolic for covering him from missing doses) he is on a statin as well as dual antiplatelet.  Low threshold for Myoview stress test to evaluate for further ischemia especially if he gets more dyspneic. He is just trying to limit cost and testing.   Cardiomyopathy, ischemic-EF 40% by echo  Borderline heart failure symptoms. We need to recheck his echo. Distal evidence of increased atrial pressures, I would consider starting Lasix. He needs to get back on his ACE inhibitor and continue carvedilol. 1 [ACE inhibitor should probably start  at half a dose of (5 mg).   Dyspnea Not sure if this is related to the cardiomyopathy or obesity. He has been relatively sedentary.  We'll see what his echo shows. Within the-year-old like to check a Myoview to assess for further ischemia.   Obesity (BMI 30-39.9) He's lost back some of the weight that he gained. Trying to at least adjust his diet. Not getting enough exercise however.   Essential hypertension, benign Actually relatively well controlled considering all his ACE inhibitor. Restart ACE inhibitor as soon as he gets to medication refill.   Dyslipidemia with high LDL and low HDL Continue statin. If atorvastatin generic is too expensive we need to get him on something. Perhaps the samples intermittently     Orders Placed This Encounter  Procedures  . Lipid panel    Order Specific Question:  Has the patient fasted?    Answer:  Yes  . Comprehensive metabolic panel    Order Specific Question:  Has the patient fasted?    Answer:  Yes  . 2D Echocardiogram without contrast    Standing Status: Future     Number of Occurrences:      Standing Expiration Date: 01/20/2016    Scheduling Instructions:     SCHEDULE IN MARCH 2016    Order Specific Question:  Type of Echo    Answer:  Complete    Order Specific Question:  Where should this test be performed    Answer:  [facility]    Order Specific Question:  Reason for exam-Echo    Answer:  Cardiomyopathy-Ischemic  414.8 / I25.5    Order Specific Question:  Reason for exam-Echo    Answer:  CAD Native Vessel  414.01 / I25.10   Meds ordered this encounter  Medications  . carvedilol (COREG) 25 MG tablet    Sig: Take 25 mg by mouth 2 (two) times daily with a meal.  . nebivolol (BYSTOLIC) 10 MG tablet    Sig: Take 1 tablet (10 mg total) by mouth daily.    Dispense:  14 tablet    Refill:  0    LOT [id number] EXP 04/2015     Followup: 6 months   HARDING, Piedad Climes, M.D., M.S. Interventional Cardiologist   Pager #  [phone]      

## 2015-01-22 NOTE — Assessment & Plan Note (Signed)
Somewhat surprising presentation is relatively late presentation. His symptoms were relatively strange. He still has a diagonal branch lesion the was not intervened on. He is not noticing exertional chest pressure but is noticing dyspnea. He had a drop in EF is due for a followup echocardiogram to reassess the EF and wall motion.

## 2015-01-22 NOTE — Assessment & Plan Note (Signed)
Not sure if this is related to the cardiomyopathy or obesity. He has been relatively sedentary.  We'll see what his echo shows. Within the-year-old like to check a Myoview to assess for further ischemia.

## 2015-01-22 NOTE — Assessment & Plan Note (Signed)
As indicated above. He still is diagonal lesion. He was to be on a beta blocker which we will refill (I'm giving him some samples of bystolic for covering him from missing doses) he is on a statin as well as dual antiplatelet.  Low threshold for Myoview stress test to evaluate for further ischemia especially if he gets more dyspneic. He is just trying to limit cost and testing.

## 2015-01-22 NOTE — Assessment & Plan Note (Signed)
He's lost back some of the weight that he gained. Trying to at least adjust his diet. Not getting enough exercise however.

## 2015-02-09 ENCOUNTER — Ambulatory Visit (HOSPITAL_COMMUNITY)
Admission: RE | Admit: 2015-02-09 | Discharge: 2015-02-09 | Disposition: A | Discharge status: Home / Self Care | Payer: BLUE CROSS/BLUE SHIELD | Source: Ambulatory Visit | Attending: Cardiology | Admitting: Cardiology

## 2015-02-09 DIAGNOSIS — I2510 Atherosclerotic heart disease of native coronary artery without angina pectoris: Secondary | ICD-10-CM | POA: Diagnosis not present

## 2015-02-09 DIAGNOSIS — I255 Ischemic cardiomyopathy: Secondary | ICD-10-CM | POA: Diagnosis present

## 2015-02-09 DIAGNOSIS — I10 Essential (primary) hypertension: Secondary | ICD-10-CM | POA: Diagnosis not present

## 2015-02-09 DIAGNOSIS — E785 Hyperlipidemia, unspecified: Secondary | ICD-10-CM

## 2015-02-09 DIAGNOSIS — Z9861 Coronary angioplasty status: Secondary | ICD-10-CM | POA: Diagnosis not present

## 2015-02-09 DIAGNOSIS — E784 Other hyperlipidemia: Secondary | ICD-10-CM | POA: Insufficient documentation

## 2015-02-09 NOTE — Progress Notes (Signed)
2D Echo Performed 02/09/2015    Jonathan Clayton, RCS  

## 2015-02-11 ENCOUNTER — Telehealth: Payer: Self-pay | Admitting: *Deleted

## 2015-02-11 NOTE — Telephone Encounter (Signed)
Left message on voicemail to call back Release to my chart

## 2015-02-11 NOTE — Telephone Encounter (Signed)
-----   Message from Marykay Lex, MD sent at 02/09/2015  6:39 PM EST ----- Good News.   Pump function has improved significantly - essentially back to normal.  The apical dyskinesis is consistent with the MI (heart attack) distribution. We still have a stiff pumping chamber, so BP control is important.  Marykay Lex, MD

## 2015-02-12 NOTE — Telephone Encounter (Signed)
Spoke to patient. Result given . Verbalized understanding Patient wanted to set up for next appointment. RN informed patient he has a recall for 07/2015.

## 2015-03-04 ENCOUNTER — Other Ambulatory Visit: Payer: Self-pay

## 2015-03-04 MED ORDER — GABAPENTIN 600 MG PO TABS: 600.0000 mg | ORAL_TABLET | Freq: Two times a day (BID) | ORAL | Status: DC

## 2015-03-04 NOTE — Telephone Encounter (Signed)
Note to lpn that she can fill script for neurontin with 3 refills.

## 2015-03-10 ENCOUNTER — Ambulatory Visit (HOSPITAL_BASED_OUTPATIENT_CLINIC_OR_DEPARTMENT_OTHER)
Admission: RE | Admit: 2015-03-10 | Discharge: 2015-03-10 | Disposition: A | Discharge status: Home / Self Care | Payer: BLUE CROSS/BLUE SHIELD | Source: Ambulatory Visit | Attending: Medical | Admitting: Medical

## 2015-03-10 ENCOUNTER — Ambulatory Visit (INDEPENDENT_AMBULATORY_CARE_PROVIDER_SITE_OTHER): Payer: BLUE CROSS/BLUE SHIELD | Admitting: Medical

## 2015-03-10 ENCOUNTER — Encounter: Payer: Self-pay | Admitting: Medical

## 2015-03-10 VITALS — BP 130/80 | HR 72 | Temp 98.1°F | Ht 71.0 in | Wt 235.8 lb

## 2015-03-10 DIAGNOSIS — M549 Dorsalgia, unspecified: Secondary | ICD-10-CM | POA: Insufficient documentation

## 2015-03-10 DIAGNOSIS — B49 Unspecified mycosis: Secondary | ICD-10-CM

## 2015-03-10 DIAGNOSIS — I10 Essential (primary) hypertension: Secondary | ICD-10-CM

## 2015-03-10 DIAGNOSIS — M25552 Pain in left hip: Secondary | ICD-10-CM

## 2015-03-10 DIAGNOSIS — M47897 Other spondylosis, lumbosacral region: Secondary | ICD-10-CM | POA: Diagnosis not present

## 2015-03-10 DIAGNOSIS — E1165 Type 2 diabetes mellitus with hyperglycemia: Secondary | ICD-10-CM | POA: Diagnosis not present

## 2015-03-10 DIAGNOSIS — M545 Low back pain, unspecified: Secondary | ICD-10-CM

## 2015-03-10 DIAGNOSIS — E784 Other hyperlipidemia: Secondary | ICD-10-CM

## 2015-03-10 DIAGNOSIS — E785 Hyperlipidemia, unspecified: Secondary | ICD-10-CM

## 2015-03-10 DIAGNOSIS — M5127 Other intervertebral disc displacement, lumbosacral region: Secondary | ICD-10-CM | POA: Insufficient documentation

## 2015-03-10 MED ORDER — NYSTATIN 100000 UNIT/GM EX CREA: 1.0000 "application " | TOPICAL_CREAM | Freq: Two times a day (BID) | CUTANEOUS | Status: AC

## 2015-03-10 MED ORDER — LISINOPRIL 20 MG PO TABS: 20.0000 mg | ORAL_TABLET | Freq: Every day | ORAL | Status: DC

## 2015-03-10 NOTE — Progress Notes (Signed)
Pre visit review using our clinic review tool, if applicable. No additional management support is needed unless otherwise documented below in the visit note. 

## 2015-03-10 NOTE — Assessment & Plan Note (Signed)
Small area under chest. Area of chest wall/abdomend crease. On both sides. Rx nystatin.

## 2015-03-10 NOTE — Patient Instructions (Addendum)
Back pain Will get xray of lumbar spine.   Left hip pain Will get xray of left hip.   Diabetes Continue management by endocrinologist.   Essential hypertension, benign Well controlled today. Continue med management.   Dyslipidemia with high LDL and low HDL Will put in fasting lab order to get test done within next week with cmp.   Fungal infection Small area under chest. Area of chest wall/abdomend crease. On both sides. Rx nystatin.     Rx refill of lisinopril(will follow kidney function and may need to make adjustments)  Follow up in 3 months or as needed.

## 2015-03-10 NOTE — Assessment & Plan Note (Signed)
Will get xray of left hip.

## 2015-03-10 NOTE — Assessment & Plan Note (Signed)
Will put in fasting lab order to get test done within next week with cmp.

## 2015-03-10 NOTE — Assessment & Plan Note (Signed)
Continue management by endocrinologist.

## 2015-03-10 NOTE — Progress Notes (Signed)
Subjective:    Patient ID: Jonathan Clayton, male    DOB: 03/10/1966, 49 y.o.   MRN: [id number]  HPI  Pt in for follow up. Pt has htn, diabetes and hyperlipidemia.  Pt  last 3 bp were all good systolic 120 and diastolic 60-80. Initial bp reading today in office was high. Pt has not been checking his bp.   Pt diabetic. His last a1-c was 11.9. He is seeing the endocrinologist.  He is insulin toujeoa(pt is on 70 units a day of).  Pt feeling diabetic nerve pain. He is trying to get disability. Ptis on neurontin.  Pt has hyperlipidemia. Last lipid has been a while. He had a lab order placed for feb 10 th. Looks like he did not get that done. Pt is not fasting today.  Pt has slight rash underneath his chest. Both sides no itch. Noticed this about one.   Pt has history of stents. No chest pain. Pt is on brilinta.  Pt states he does have some lower back pain and when walking some pain in his left hip. Pain since 2014. Some rt great region pain 2 years ago. This is resolved.   Back pain when he gets up from sitting.       Review of Systems  Constitutional: Negative for fever, chills and fatigue.  Respiratory: Negative for cough, choking, chest tightness, shortness of breath and wheezing.   Cardiovascular: Negative for chest pain.  Gastrointestinal: Negative for abdominal pain.  Endocrine: Negative for polydipsia, polyphagia and polyuria.  Musculoskeletal: Positive for back pain.       Lt hip pain.  Skin: Positive for rash.  Neurological: Negative for dizziness, seizures, speech difficulty, numbness and headaches.  Hematological: Negative for adenopathy. Does not bruise/bleed easily.  Psychiatric/Behavioral: Negative for behavioral problems and confusion.   Past Medical History  Diagnosis Date  . STEMI of inferolateral wall- 08/07/14 08/07/2014  . CAD S/P PCI mLAD & Cx-OM (Promus DES) 08/07/2014    mLAD & D2 100% - PCI to LAD w/ Promus P DES 2.25 x 20 & 8 (2.4), D2 Med Rx; Cx-OM tandem  80% -> PCI Promus P DES 2.75 x 16 & 3.0 x 24 (3 mm distal & 3.25 mm prox)  . Cardiomyopathy, ischemic-EF 40% by echo  09/01/2014    Echo: 8/30: Moderate LVH, EF roughly 40%. Pseudo-normal Gr2 DD, apical akinesis, moderate HK of apical inferior mid inferolateral and anterolateral as well as apical septal and apical lateral walls with mild HK of apical anterior   . Hx of stroke February 17 2010  . Essential hypertension, benign   . Dyslipidemia with high LDL and low HDL   . 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 Diabetic neuropathy associated with type 2 diabetes mellitus   . GERD (gastroesophageal reflux disease)   . Osteoarthritis of back     With chronic low back pain    History   Social History  . Marital Status: Married    Spouse Name: N/A  . Number of Children: N/A  . Years of Education: N/A   Occupational History  . Not on file.   Social History Main Topics  . Smoking status: Never Smoker   . Smokeless tobacco: Not on file  . Alcohol Use: No  . Drug Use: No  . Sexual Activity: Yes   Other Topics Concern  . Not on file   Social History Narrative   Married. Does not smoke -- never smoked.  He is currently doing cardiac rehabilitation    Past Surgical History  Procedure Laterality Date  . Percutaneous coronary stent intervention (pci-s)  08/07/2014     PCI-mCx-OM2 tandem 80% - Promus P DES 2.75 mm x 16 mm and 3.0 mm x 24 mm (3 mm distal & 3.25 mm prox); Mid LAD 100% - 2 Overlapping Promus P DES 2.25 mm x 20 mm & 2.25 mm x 8 mm (2.4 mm) only TIMI2 apical flow, Occluded D2 (not intervened on)  . Transthoracic echocardiogram  07/13/2014    Moderate LVH, EF roughly 40%. Pseudo-normal Gr2 DD, apical akinesis, moderate HK of apical inferior mid inferolateral and anterolateral as well as apical septal and apical lateral walls with mild HK of apical anterior  . Left heart catheterization with coronary angiogram N/A 08/07/2014    Procedure: LEFT HEART CATHETERIZATION  WITH CORONARY ANGIOGRAM;  Surgeon: Marykay Lex, MD;  Location: [facility] CATH LAB;  Service: Cardiovascular;  Laterality: N/A;    Family History  Problem Relation Age of Onset  . Cancer Father   . Diabetes Neg Hx     No Known Allergies  Current Outpatient Prescriptions on File Prior to Visit  Medication Sig Dispense Refill  . acetaminophen (TYLENOL) 500 MG tablet Take 1,000 mg by mouth every 6 (six) hours as needed for moderate pain.    Marland Kitchen aspirin 81 MG chewable tablet Chew 1 tablet (81 mg total) by mouth daily.    Marland Kitchen atorvastatin (LIPITOR) 40 MG tablet Take 1 tablet (40 mg total) by mouth daily at 6 PM. 30 tablet 5  . carvedilol (COREG) 25 MG tablet Take 25 mg by mouth 2 (two) times daily with a meal.    . gabapentin (NEURONTIN) 600 MG tablet Take 1 tablet (600 mg total) by mouth 2 (two) times daily. 60 tablet 3  . glucose blood (RELION GLUCOSE TEST STRIPS) test strip 1 each by Other route 2 (two) times daily. And lancets 2/day 250.01 100 each 12  . nitroGLYCERIN (NITROSTAT) 0.4 MG SL tablet Place 1 tablet (0.4 mg total) under the tongue every 5 (five) minutes as needed for chest pain. 25 tablet 2  . ticagrelor (BRILINTA) 90 MG TABS tablet Take 1 tablet (90 mg total) by mouth 2 (two) times daily. 60 tablet 10  . Insulin Glargine (TOUJEO SOLOSTAR) 300 UNIT/ML SOPN Inject 70 Units into the skin every morning. And pen needles 1/day (Patient taking differently: Inject 90 Units into the skin every morning. And pen needles 1/day) 10 pen 11  . ranitidine (ZANTAC) 75 MG tablet Take 75 mg by mouth 2 (two) times daily as needed for heartburn.      No current facility-administered medications on file prior to visit.    BP 130/80 mmHg  Pulse 72  Temp(Src) 98.1 F (36.7 C) (Oral)  Ht 5\' 11"  (1.803 m)  Wt 235 lb 12.8 oz (106.958 kg)  BMI 32.90 kg/m2  SpO2 98%      Objective:   Physical Exam  General Mental Status- Alert. General Appearance- Not in acute distress.   Skin General: Color-  Normal Color. Moisture- Normal Moisture. Mild faint red rash under both chest region. Crease abdomen/chest. Neck Carotid Arteries- Normal color. Moisture- Normal Moisture. No carotid bruits. No JVD.  Chest and Lung Exam Auscultation: Breath Sounds:-Normal.  Cardiovascular Auscultation:Rythm- Regular. Murmurs & Other Heart Sounds:Auscultation of the heart reveals- No Murmurs.  Abdomen Inspection:-Inspeection Normal. Palpation/Percussion:Note:No mass. Palpation and Percussion of the abdomen reveal- Non Tender, Non Distended + BS, no  rebound or guarding.    Neurologic Cranial Nerve exam:- CN III-XII intact(No nystagmus), symmetric smile. Drift Test:- No drift. Romberg Exam:- Negative.  Heal to Toe Gait exam:-Normal. Finger to Nose:- Normal/Intact Strength:- 5/5 equal and symmetric strength both upper and lower extremities.  Lower ext- no ulcers of feet, normal sensation. Good pulses. See quality metrics    Back Mid lumbar spine tenderness to palpation. Pain on straight leg lift. Pain on lateral movements and flexion/extension of the spine.  Lower ext neurologic  L5-S1 sensation intact bilaterally. Normal patellar reflexes bilaterally. No foot drop bilaterally.      Assessment & Plan:

## 2015-03-10 NOTE — Assessment & Plan Note (Signed)
Well controlled today. Continue med management.

## 2015-03-10 NOTE — Assessment & Plan Note (Signed)
Will get xray of lumbar spine.

## 2015-03-11 ENCOUNTER — Telehealth: Payer: Self-pay | Admitting: Medical

## 2015-03-11 DIAGNOSIS — M4310 Spondylolisthesis, site unspecified: Secondary | ICD-10-CM

## 2015-03-11 NOTE — Telephone Encounter (Signed)
Refer to neurosurgeon 

## 2015-03-11 NOTE — Telephone Encounter (Signed)
Note to referral staff. 

## 2015-03-17 ENCOUNTER — Ambulatory Visit (INDEPENDENT_AMBULATORY_CARE_PROVIDER_SITE_OTHER): Payer: BLUE CROSS/BLUE SHIELD | Admitting: Endocrinology

## 2015-03-17 ENCOUNTER — Encounter: Payer: Self-pay | Admitting: Endocrinology

## 2015-03-17 VITALS — BP 128/80 | HR 80 | Temp 97.5°F | Ht 71.0 in | Wt 236.0 lb

## 2015-03-17 DIAGNOSIS — E1165 Type 2 diabetes mellitus with hyperglycemia: Secondary | ICD-10-CM | POA: Diagnosis not present

## 2015-03-17 MED ORDER — INSULIN GLARGINE 300 UNIT/ML ~~LOC~~ SOPN: 100.0000 [IU] | PEN_INJECTOR | SUBCUTANEOUS | Status: DC

## 2015-03-17 NOTE — Progress Notes (Signed)
Subjective:    Patient ID: Jonathan Clayton, male    DOB: 1966/03/10, 49 y.o.   MRN: [id number]  HPI Pt returns for f/u of diabetes mellitus: DM type: Insulin-requiring type 2 Dx'ed: 2000 Complications: painful neuropathy of the lower extremities, CAD, CVA, and renal insufficiency.  Therapy: insulin since 2010 DKA: never Severe hypoglycemia: never Pancreatitis: never Other: due to h/o noncompliance, he is on qd insulin; he is awaiting a disability hearing.  Interval history: He takes toujeo only 70 units qd.  no cbg record, but states cbg's are in the 200's.  Pt says he missed the insulin x 2 weeks, 2 mos ago, but he now takes as rx'ed.  Past Medical History  Diagnosis Date  . STEMI of inferolateral wall- 08/07/14 08/07/2014  . CAD S/P PCI mLAD & Cx-OM (Promus DES) 08/07/2014    mLAD & D2 100% - PCI to LAD w/ Promus P DES 2.25 x 20 & 8 (2.4), D2 Med Rx; Cx-OM tandem 80% -> PCI Promus P DES 2.75 x 16 & 3.0 x 24 (3 mm distal & 3.25 mm prox)  . Cardiomyopathy, ischemic-EF 40% by echo  09/01/2014    Echo: 8/30: Moderate LVH, EF roughly 40%. Pseudo-normal Gr2 DD, apical akinesis, moderate HK of apical inferior mid inferolateral and anterolateral as well as apical septal and apical lateral walls with mild HK of apical anterior   . Hx of stroke February 17 2010  . Essential hypertension, benign   . Dyslipidemia with high LDL and low HDL   . 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 Diabetic neuropathy associated with type 2 diabetes mellitus   . GERD (gastroesophageal reflux disease)   . Osteoarthritis of back     With chronic low back pain    Past Surgical History  Procedure Laterality Date  . Percutaneous coronary stent intervention (pci-s)  08/07/2014     PCI-mCx-OM2 tandem 80% - Promus P DES 2.75 mm x 16 mm and 3.0 mm x 24 mm (3 mm distal & 3.25 mm prox); Mid LAD 100% - 2 Overlapping Promus P DES 2.25 mm x 20 mm & 2.25 mm x 8 mm (2.4 mm) only TIMI2 apical flow, Occluded D2 (not  intervened on)  . Transthoracic echocardiogram  07/13/2014    Moderate LVH, EF roughly 40%. Pseudo-normal Gr2 DD, apical akinesis, moderate HK of apical inferior mid inferolateral and anterolateral as well as apical septal and apical lateral walls with mild HK of apical anterior  . Left heart catheterization with coronary angiogram N/A 08/07/2014    Procedure: LEFT HEART CATHETERIZATION WITH CORONARY ANGIOGRAM;  Surgeon: Marykay Lex, MD;  Location: [facility] CATH LAB;  Service: Cardiovascular;  Laterality: N/A;    History   Social History  . Marital Status: Married    Spouse Name: N/A  . Number of Children: N/A  . Years of Education: N/A   Occupational History  . Not on file.   Social History Main Topics  . Smoking status: Never Smoker   . Smokeless tobacco: Not on file  . Alcohol Use: No  . Drug Use: No  . Sexual Activity: Yes   Other Topics Concern  . Not on file   Social History Narrative   Married. Does not smoke -- never smoked.   He is currently doing cardiac rehabilitation    Current Outpatient Prescriptions on File Prior to Visit  Medication Sig Dispense Refill  . aspirin 81 MG chewable tablet Chew 1 tablet (81 mg  total) by mouth daily.    Marland Kitchen atorvastatin (LIPITOR) 40 MG tablet Take 1 tablet (40 mg total) by mouth daily at 6 PM. 30 tablet 5  . carvedilol (COREG) 25 MG tablet Take 25 mg by mouth 2 (two) times daily with a meal.    . gabapentin (NEURONTIN) 600 MG tablet Take 1 tablet (600 mg total) by mouth 2 (two) times daily. 60 tablet 3  . glucose blood (RELION GLUCOSE TEST STRIPS) test strip 1 each by Other route 2 (two) times daily. And lancets 2/day 250.01 100 each 12  . lisinopril (PRINIVIL,ZESTRIL) 20 MG tablet Take 1 tablet (20 mg total) by mouth daily. 30 tablet 3  . nitroGLYCERIN (NITROSTAT) 0.4 MG SL tablet Place 1 tablet (0.4 mg total) under the tongue every 5 (five) minutes as needed for chest pain. 25 tablet 2  . nystatin cream (MYCOSTATIN) Apply 1  application topically 2 (two) times daily. 30 g 0  . ranitidine (ZANTAC) 75 MG tablet Take 75 mg by mouth 2 (two) times daily as needed for heartburn.     . ticagrelor (BRILINTA) 90 MG TABS tablet Take 1 tablet (90 mg total) by mouth 2 (two) times daily. 60 tablet 10  . acetaminophen (TYLENOL) 500 MG tablet Take 1,000 mg by mouth every 6 (six) hours as needed for moderate pain.     No current facility-administered medications on file prior to visit.    No Known Allergies  Family History  Problem Relation Age of Onset  . Cancer Father   . Diabetes Neg Hx     BP 128/80 mmHg  Pulse 80  Temp(Src) 97.5 F (36.4 C) (Oral)  Ht 5\' 11"  (1.803 m)  Wt 236 lb (107.049 kg)  BMI 32.93 kg/m2  SpO2 96%    Review of Systems He denies hypoglycemia and weight change    Objective:   Physical Exam VITAL SIGNS:  See vs page GENERAL: no distress Pulses: dorsalis pedis intact bilat.   MSK: no deformity of the feet CV: 1+ bilat leg edema Skin:  no ulcer on the feet.  normal color and temp on the feet. Neuro: sensation is intact to touch on the feet, but decreased from normal.      Assessment & Plan:  DM: persistent poor control Noncompliance with cbg recording and insulin, not improved: I'll work around this as best I can.  i advised pt of risks.    Patient is advised the following: Patient Instructions  check your blood sugar twice a day.  vary the time of day when you check, between before the 3 meals, and at bedtime.  also check if you have symptoms of your blood sugar being too high or too low.  please keep a record of the readings and bring it to your next appointment here.  You can write it on any piece of paper.  please call us sooner if your blood sugar goes below 70, or if you have a lot of readings over 200.     It is really important that you get the insulin each day.  Please let me know if you have trouble getting it.   Please come back for a follow-up appointment in 1 month.    Please increase the toujeo to 100 units each morning.

## 2015-03-17 NOTE — Patient Instructions (Addendum)
check your blood sugar twice a day.  vary the time of day when you check, between before the 3 meals, and at bedtime.  also check if you have symptoms of your blood sugar being too high or too low.  please keep a record of the readings and bring it to your next appointment here.  You can write it on any piece of paper.  please call us sooner if your blood sugar goes below 70, or if you have a lot of readings over 200.     It is really important that you get the insulin each day.  Please let me know if you have trouble getting it.   Please come back for a follow-up appointment in 1 month.  Please increase the toujeo to 100 units each morning.

## 2015-04-16 ENCOUNTER — Ambulatory Visit: Payer: BLUE CROSS/BLUE SHIELD | Admitting: Endocrinology

## 2015-04-19 ENCOUNTER — Ambulatory Visit: Payer: BLUE CROSS/BLUE SHIELD | Admitting: Endocrinology

## 2015-04-21 ENCOUNTER — Encounter: Payer: Self-pay | Admitting: Endocrinology

## 2015-04-21 ENCOUNTER — Ambulatory Visit (INDEPENDENT_AMBULATORY_CARE_PROVIDER_SITE_OTHER): Payer: BLUE CROSS/BLUE SHIELD | Admitting: Endocrinology

## 2015-04-21 VITALS — BP 136/88 | HR 70 | Temp 98.0°F | Wt 234.2 lb

## 2015-04-21 DIAGNOSIS — E1165 Type 2 diabetes mellitus with hyperglycemia: Secondary | ICD-10-CM

## 2015-04-21 MED ORDER — INSULIN GLARGINE 300 UNIT/ML ~~LOC~~ SOPN: 120.0000 [IU] | PEN_INJECTOR | SUBCUTANEOUS | Status: DC

## 2015-04-21 NOTE — Progress Notes (Signed)
Subjective:    Patient ID: Jonathan Clayton, male    DOB: 03-20-1966, 49 y.o.   MRN: [id number]  HPI Pt returns for f/u of diabetes mellitus: DM type: Insulin-requiring type 2 Dx'ed: 2000 Complications: painful neuropathy of the lower extremities, CAD, CVA, and renal insufficiency.  Therapy: insulin since 2010 DKA: never Severe hypoglycemia: never. Pancreatitis: never. Other: due to h/o noncompliance, he is on qd insulin; he is awaiting a disability hearing.  Interval history: He takes toujeo, 100 units qd.  no cbg record, but states cbg's are still persistently in the 200's.  Pt says he takes insulin as rx'ed.  Hx is from pt and wife.   Past Medical History  Diagnosis Date  . STEMI of inferolateral wall- 08/07/14 08/07/2014  . CAD S/P PCI mLAD & Cx-OM (Promus DES) 08/07/2014    mLAD & D2 100% - PCI to LAD w/ Promus P DES 2.25 x 20 & 8 (2.4), D2 Med Rx; Cx-OM tandem 80% -> PCI Promus P DES 2.75 x 16 & 3.0 x 24 (3 mm distal & 3.25 mm prox)  . Cardiomyopathy, ischemic-EF 40% by echo  09/01/2014    Echo: 8/30: Moderate LVH, EF roughly 40%. Pseudo-normal Gr2 DD, apical akinesis, moderate HK of apical inferior mid inferolateral and anterolateral as well as apical septal and apical lateral walls with mild HK of apical anterior   . Hx of stroke February 17 2010  . Essential hypertension, benign   . Dyslipidemia with high LDL and low HDL   . 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 Diabetic neuropathy associated with type 2 diabetes mellitus   . GERD (gastroesophageal reflux disease)   . Osteoarthritis of back     With chronic low back pain    Past Surgical History  Procedure Laterality Date  . Percutaneous coronary stent intervention (pci-s)  08/07/2014     PCI-mCx-OM2 tandem 80% - Promus P DES 2.75 mm x 16 mm and 3.0 mm x 24 mm (3 mm distal & 3.25 mm prox); Mid LAD 100% - 2 Overlapping Promus P DES 2.25 mm x 20 mm & 2.25 mm x 8 mm (2.4 mm) only TIMI2 apical flow, Occluded D2 (not  intervened on)  . Transthoracic echocardiogram  07/13/2014    Moderate LVH, EF roughly 40%. Pseudo-normal Gr2 DD, apical akinesis, moderate HK of apical inferior mid inferolateral and anterolateral as well as apical septal and apical lateral walls with mild HK of apical anterior  . Left heart catheterization with coronary angiogram N/A 08/07/2014    Procedure: LEFT HEART CATHETERIZATION WITH CORONARY ANGIOGRAM;  Surgeon: Marykay Lex, MD;  Location: [facility] CATH LAB;  Service: Cardiovascular;  Laterality: N/A;    History   Social History  . Marital Status: Married    Spouse Name: N/A  . Number of Children: N/A  . Years of Education: N/A   Occupational History  . Not on file.   Social History Main Topics  . Smoking status: Never Smoker   . Smokeless tobacco: Not on file  . Alcohol Use: No  . Drug Use: No  . Sexual Activity: Yes   Other Topics Concern  . Not on file   Social History Narrative   Married. Does not smoke -- never smoked.   He is currently doing cardiac rehabilitation    Current Outpatient Prescriptions on File Prior to Visit  Medication Sig Dispense Refill  . aspirin 81 MG chewable tablet Chew 1 tablet (81 mg total) by  mouth daily.    Marland Kitchen atorvastatin (LIPITOR) 40 MG tablet Take 1 tablet (40 mg total) by mouth daily at 6 PM. 30 tablet 5  . carvedilol (COREG) 25 MG tablet Take 25 mg by mouth 2 (two) times daily with a meal.    . gabapentin (NEURONTIN) 600 MG tablet Take 1 tablet (600 mg total) by mouth 2 (two) times daily. 60 tablet 3  . glucose blood (RELION GLUCOSE TEST STRIPS) test strip 1 each by Other route 2 (two) times daily. And lancets 2/day 250.01 100 each 12  . lisinopril (PRINIVIL,ZESTRIL) 20 MG tablet Take 1 tablet (20 mg total) by mouth daily. 30 tablet 3  . nitroGLYCERIN (NITROSTAT) 0.4 MG SL tablet Place 1 tablet (0.4 mg total) under the tongue every 5 (five) minutes as needed for chest pain. 25 tablet 2  . nystatin cream (MYCOSTATIN) Apply 1  application topically 2 (two) times daily. 30 g 0  . ranitidine (ZANTAC) 75 MG tablet Take 75 mg by mouth 2 (two) times daily as needed for heartburn.     . ticagrelor (BRILINTA) 90 MG TABS tablet Take 1 tablet (90 mg total) by mouth 2 (two) times daily. 60 tablet 10  . acetaminophen (TYLENOL) 500 MG tablet Take 1,000 mg by mouth every 6 (six) hours as needed for moderate pain.    . nebivolol (BYSTOLIC) 10 MG tablet Take 10 mg by mouth daily.     No current facility-administered medications on file prior to visit.    No Known Allergies  Family History  Problem Relation Age of Onset  . Cancer Father   . Diabetes Neg Hx     BP 136/88 mmHg  Pulse 70  Temp(Src) 98 F (36.7 C) (Oral)  Wt 234 lb 3.2 oz (106.232 kg)  SpO2 95%    Review of Systems He denies hypoglycemia.  He has lost a few lbs, since last ov.    Objective:   Physical Exam VITAL SIGNS:  See vs page GENERAL: no distress Pulses: dorsalis pedis intact bilat.   MSK: no deformity of the feet CV: trace bilat leg edema.  Skin:  no ulcer on the feet.  normal color and temp on the feet.  Neuro: sensation is intact to touch on the feet, but decreased from normal.    Lab Results  Component Value Date   HGBA1C 11.9* 12/16/2014       Assessment & Plan:  DM: poor control.  At first, pt hesitates to increase insulin.  After discussion, he agrees Noncompliance with cbg recording: persistent Weigh loss, prob due to severe hyperglycemia: we'll follow.  Patient is advised the following: Patient Instructions  check your blood sugar twice a day.  vary the time of day when you check, between before the 3 meals, and at bedtime.  also check if you have symptoms of your blood sugar being too high or too low.  please keep a record of the readings and bring it to your next appointment here.  You can write it on any piece of paper.  please call us sooner if your blood sugar goes below 70, or if you have a lot of readings over 200.       It is really important that you get the insulin each day.  Please let me know if you have trouble getting it.   Please come back for a follow-up appointment in 2 months.  Please increase the toujeo to 120 units each morning.

## 2015-04-21 NOTE — Patient Instructions (Addendum)
check your blood sugar twice a day.  vary the time of day when you check, between before the 3 meals, and at bedtime.  also check if you have symptoms of your blood sugar being too high or too low.  please keep a record of the readings and bring it to your next appointment here.  You can write it on any piece of paper.  please call us sooner if your blood sugar goes below 70, or if you have a lot of readings over 200.     It is really important that you get the insulin each day.  Please let me know if you have trouble getting it.   Please come back for a follow-up appointment in 2 months.  Please increase the toujeo to 120 units each morning.

## 2015-06-10 ENCOUNTER — Encounter: Payer: Self-pay | Admitting: Medical

## 2015-06-10 ENCOUNTER — Ambulatory Visit (INDEPENDENT_AMBULATORY_CARE_PROVIDER_SITE_OTHER): Payer: BLUE CROSS/BLUE SHIELD | Admitting: Medical

## 2015-06-10 ENCOUNTER — Other Ambulatory Visit: Payer: Self-pay

## 2015-06-10 ENCOUNTER — Ambulatory Visit (HOSPITAL_BASED_OUTPATIENT_CLINIC_OR_DEPARTMENT_OTHER)
Admission: RE | Admit: 2015-06-10 | Discharge: 2015-06-10 | Disposition: A | Discharge status: Home / Self Care | Payer: BLUE CROSS/BLUE SHIELD | Source: Ambulatory Visit | Attending: Medical | Admitting: Medical

## 2015-06-10 VITALS — BP 136/79 | HR 76 | Temp 98.1°F | Ht 71.0 in | Wt 238.0 lb

## 2015-06-10 DIAGNOSIS — E1169 Type 2 diabetes mellitus with other specified complication: Secondary | ICD-10-CM

## 2015-06-10 DIAGNOSIS — M545 Low back pain: Secondary | ICD-10-CM

## 2015-06-10 DIAGNOSIS — I10 Essential (primary) hypertension: Secondary | ICD-10-CM

## 2015-06-10 DIAGNOSIS — I517 Cardiomegaly: Secondary | ICD-10-CM | POA: Insufficient documentation

## 2015-06-10 DIAGNOSIS — R0602 Shortness of breath: Secondary | ICD-10-CM | POA: Diagnosis present

## 2015-06-10 DIAGNOSIS — E785 Hyperlipidemia, unspecified: Secondary | ICD-10-CM

## 2015-06-10 DIAGNOSIS — R0600 Dyspnea, unspecified: Secondary | ICD-10-CM | POA: Diagnosis not present

## 2015-06-10 DIAGNOSIS — E784 Other hyperlipidemia: Secondary | ICD-10-CM | POA: Diagnosis not present

## 2015-06-10 DIAGNOSIS — I252 Old myocardial infarction: Secondary | ICD-10-CM | POA: Insufficient documentation

## 2015-06-10 MED ORDER — TRAMADOL HCL 50 MG PO TABS
50.0000 mg | ORAL_TABLET | Freq: Four times a day (QID) | ORAL | Status: DC | PRN
Start: 2015-06-10 — End: 2015-12-16

## 2015-06-10 MED ORDER — LISINOPRIL 20 MG PO TABS: 20.0000 mg | ORAL_TABLET | Freq: Every day | ORAL | Status: DC

## 2015-06-10 NOTE — Progress Notes (Signed)
Pre visit review using our clinic review tool, if applicable. No additional management support is needed unless otherwise documented below in the visit note. 

## 2015-06-10 NOTE — Telephone Encounter (Signed)
I refilled his lisinopril today. Rechecking his lipid panel before rx of lipitor.

## 2015-06-10 NOTE — Patient Instructions (Addendum)
Diabetes Continue on toujeo. Get a1-c next week. Continue with Endocrinologist.  Dyslipidemia with high LDL and low HDL Contineu atorvastatin. Get lipid panel next week fasting.  HTN (hypertension) Well controlled continue coreg and ace inhibitor. Refill ace today.  Dyspnea Mild intermittent with activity. EF 30%. 4 lb weight gain. No sob presently or cp. Will get cxr and put future bnp order to get done next week with his other labs. If any chest pain with any future sob. Then ED eval.  Back pain Follow up with neurosurgeon on the plans for mri. Tylenol for mild pain. Will rx limited tramadol for intermittent severe pain.    Follow up  3 month or as needed

## 2015-06-10 NOTE — Assessment & Plan Note (Signed)
Well controlled continue coreg and ace inhibitor. Refill ace today.

## 2015-06-10 NOTE — Assessment & Plan Note (Signed)
Follow up with neurosurgeon on the plans for mri. Tylenol for mild pain. Will rx limited tramadol for intermittent severe pain.

## 2015-06-10 NOTE — Assessment & Plan Note (Signed)
Mild intermittent with activity. EF 30%. 4 lb weight gain. No sob presently or cp. Will get cxr and put future bnp order to get done next week with his other labs. If any chest pain with any future sob. Then ED eval.

## 2015-06-10 NOTE — Assessment & Plan Note (Signed)
Continue on toujeo. Get a1-c next week. Continue with Endocrinologist.

## 2015-06-10 NOTE — Progress Notes (Signed)
Subjective:    Patient ID: Jonathan Clayton, male    DOB: 04/14/1966, 49 y.o.   MRN: [id number]  HPI   Pt in for follow up.   Pt has diabetes followed by Dr. Everardo All. Pt recently increased his toujeo to 120 units each morning(in May). He will see Dr. Everardo All on the 5th of July.  Pt bp controlled today. No cardiac or neurologic signs or symptom.  Pt not exercising. States has knee pain probhibits.  Pt states he does still have some lower back pain and when walking some pain in his left hip. He went to neurosurgeon. He states mri was being considered. But that was never done. Pt will go upstairs today to find out what status of neurosurgeon mri. When standing long time gets the back pain. Hurts with activity. Pt is trying to get disability. At time 7-8/10 level pain.  Pt does see cardiologist. He has some lvh and EF of about 40%. Some mild sob with actiivity. Not having that currently. No current sob, wheezing or chest pain on exam. Pt next appointment with cardiologist in August. No recent cxr. Pt weight  238 lb . On last visit was 234 lb.  Pt states diabetic nerve pain still issue. He is on neurontin.      Review of Systems  Constitutional: Negative for fever, chills, diaphoresis, activity change and fatigue.  Respiratory: Negative for cough, chest tightness and shortness of breath.        See hpi. No sob now. But mild with activity. No chest pain reported.  Cardiovascular: Negative for chest pain, palpitations and leg swelling.  Gastrointestinal: Negative for nausea, vomiting and abdominal pain.  Musculoskeletal: Positive for back pain. Negative for neck pain and neck stiffness.       Intermittent on and off. See HPI.  Neurological: Negative for dizziness, tremors, seizures, syncope, facial asymmetry, speech difficulty, weakness, light-headedness, numbness and headaches.       Diabetic neuropathy.  Psychiatric/Behavioral: Negative for behavioral problems, confusion and agitation. The  patient is not nervous/anxious.       Past Medical History  Diagnosis Date  . STEMI of inferolateral wall- 08/07/14 08/07/2014  . CAD S/P PCI mLAD & Cx-OM (Promus DES) 08/07/2014    mLAD & D2 100% - PCI to LAD w/ Promus P DES 2.25 x 20 & 8 (2.4), D2 Med Rx; Cx-OM tandem 80% -> PCI Promus P DES 2.75 x 16 & 3.0 x 24 (3 mm distal & 3.25 mm prox)  . Cardiomyopathy, ischemic-EF 40% by echo  09/01/2014    Echo: 8/30: Moderate LVH, EF roughly 40%. Pseudo-normal Gr2 DD, apical akinesis, moderate HK of apical inferior mid inferolateral and anterolateral as well as apical septal and apical lateral walls with mild HK of apical anterior   . Hx of stroke February 17 2010  . Essential hypertension, benign   . Dyslipidemia with high LDL and low HDL   . 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 Diabetic neuropathy associated with type 2 diabetes mellitus   . GERD (gastroesophageal reflux disease)   . Osteoarthritis of back     With chronic low back pain    History   Social History  . Marital Status: Married    Spouse Name: N/A  . Number of Children: N/A  . Years of Education: N/A   Occupational History  . Not on file.   Social History Main Topics  . Smoking status: Never Smoker   . Smokeless  tobacco: Not on file  . Alcohol Use: No  . Drug Use: No  . Sexual Activity: Yes   Other Topics Concern  . Not on file   Social History Narrative   Married. Does not smoke -- never smoked.   He is currently doing cardiac rehabilitation    Past Surgical History  Procedure Laterality Date  . Percutaneous coronary stent intervention (pci-s)  08/07/2014     PCI-mCx-OM2 tandem 80% - Promus P DES 2.75 mm x 16 mm and 3.0 mm x 24 mm (3 mm distal & 3.25 mm prox); Mid LAD 100% - 2 Overlapping Promus P DES 2.25 mm x 20 mm & 2.25 mm x 8 mm (2.4 mm) only TIMI2 apical flow, Occluded D2 (not intervened on)  . Transthoracic echocardiogram  07/13/2014    Moderate LVH, EF roughly 40%. Pseudo-normal Gr2 DD,  apical akinesis, moderate HK of apical inferior mid inferolateral and anterolateral as well as apical septal and apical lateral walls with mild HK of apical anterior  . Left heart catheterization with coronary angiogram N/A 08/07/2014    Procedure: LEFT HEART CATHETERIZATION WITH CORONARY ANGIOGRAM;  Surgeon: Marykay Lex, MD;  Location: [facility] CATH LAB;  Service: Cardiovascular;  Laterality: N/A;    Family History  Problem Relation Age of Onset  . Cancer Father   . Diabetes Neg Hx     No Known Allergies  Current Outpatient Prescriptions on File Prior to Visit  Medication Sig Dispense Refill  . acetaminophen (TYLENOL) 500 MG tablet Take 1,000 mg by mouth every 6 (six) hours as needed for moderate pain.    Marland Kitchen aspirin 81 MG chewable tablet Chew 1 tablet (81 mg total) by mouth daily.    Marland Kitchen atorvastatin (LIPITOR) 40 MG tablet Take 1 tablet (40 mg total) by mouth daily at 6 PM. 30 tablet 5  . carvedilol (COREG) 25 MG tablet Take 25 mg by mouth 2 (two) times daily with a meal.    . gabapentin (NEURONTIN) 600 MG tablet Take 1 tablet (600 mg total) by mouth 2 (two) times daily. 60 tablet 3  . glucose blood (RELION GLUCOSE TEST STRIPS) test strip 1 each by Other route 2 (two) times daily. And lancets 2/day 250.01 100 each 12  . Insulin Glargine (TOUJEO SOLOSTAR) 300 UNIT/ML SOPN Inject 120 Units into the skin every morning. And pen needles 1/day 9 pen 11  . nitroGLYCERIN (NITROSTAT) 0.4 MG SL tablet Place 1 tablet (0.4 mg total) under the tongue every 5 (five) minutes as needed for chest pain. 25 tablet 2  . nystatin cream (MYCOSTATIN) Apply 1 application topically 2 (two) times daily. 30 g 0  . ranitidine (ZANTAC) 75 MG tablet Take 75 mg by mouth 2 (two) times daily as needed for heartburn.     . ticagrelor (BRILINTA) 90 MG TABS tablet Take 1 tablet (90 mg total) by mouth 2 (two) times daily. 60 tablet 10   No current facility-administered medications on file prior to visit.    BP 136/79 mmHg   Pulse 76  Temp(Src) 98.1 F (36.7 C) (Oral)  Ht  (1.803 m)  Wt 238 lb (107.956 kg)  BMI 33.21 kg/m2  SpO2 98%       Objective:   Physical Exam   General Mental Status- Alert. General Appearance- Not in acute distress.   Skin General: Color- Normal Color. Moisture- Normal Moisture.  Neck Carotid Arteries- Normal color. Moisture- Normal Moisture. No carotid bruits. No JVD.  Chest and Lung Exam Auscultation:  Breath Sounds:-Normal. CTA.  Cardiovascular Auscultation:Rythm- Regular.Rate and Rythm Murmurs & Other Heart Sounds:Auscultation of the heart reveals- No Murmurs.  Abdomen Inspection:-Inspeection Normal. Palpation/Percussion:Note:No mass. Palpation and Percussion of the abdomen reveal- Non Tender, Non Distended + BS, no rebound or guarding.    Neurologic Cranial Nerve exam:- CN III-XII intact(No nystagmus), symmetric smile. Drift Test:- No drift. Finger to Nose:- Normal/Intact Strength:- 5/5 equal and symmetric strength both upper and lower extremities.  .  Back Mid lumbar spine tenderness to palpation. Pain on lateral movements and flexion/extension of the spine.  Lower ext neurologic  L5-S1 sensation intact bilaterally. Normal patellar reflexes bilaterally. No foot drop bilaterally.     Assessment & Plan:

## 2015-06-10 NOTE — Assessment & Plan Note (Addendum)
Contineu atorvastatin. Get lipid panel next week fasting.

## 2015-06-15 ENCOUNTER — Ambulatory Visit (INDEPENDENT_AMBULATORY_CARE_PROVIDER_SITE_OTHER): Payer: BLUE CROSS/BLUE SHIELD | Admitting: Endocrinology

## 2015-06-15 ENCOUNTER — Encounter: Payer: Self-pay | Admitting: Endocrinology

## 2015-06-15 VITALS — BP 132/85 | HR 74 | Temp 97.4°F | Ht 71.0 in | Wt 240.0 lb

## 2015-06-15 DIAGNOSIS — E1169 Type 2 diabetes mellitus with other specified complication: Secondary | ICD-10-CM | POA: Diagnosis not present

## 2015-06-15 LAB — POCT GLYCOSYLATED HEMOGLOBIN (HGB A1C): Hemoglobin A1C: 13.4

## 2015-06-15 NOTE — Patient Instructions (Addendum)
check your blood sugar twice a day.  vary the time of day when you check, between before the 3 meals, and at bedtime.  also check if you have symptoms of your blood sugar being too high or too low.  please keep a record of the readings and bring it to your next appointment here.  You can write it on any piece of paper.  please call us sooner if your blood sugar goes below 70, or if you have a lot of readings over 200.     It is really important that you get the insulin each day.   Please come back for a follow-up appointment in 3 months.

## 2015-06-15 NOTE — Progress Notes (Signed)
Subjective:    Patient ID: Jonathan Clayton, male    DOB: [birth year], 49 y.o.   MRN: [id number]  HPI Pt returns for f/u of diabetes mellitus: DM type: Insulin-requiring type 2 Dx'ed: 2000 Complications: painful neuropathy of the lower extremities, CAD, CVA, and renal insufficiency.  Therapy: insulin since 2010 DKA: never Severe hypoglycemia: never. Pancreatitis: never. Other: due to h/o noncompliance, he is on qd insulin; he is still awaiting a disability hearing.  Interval history: Pt says he cannot afford the $15 copay for his insulin.   no cbg record, but states cbg's was 173 this am.  pt states he feels well in general.   Past Medical History  Diagnosis Date  . STEMI of inferolateral wall- 08/07/14 08/07/2014  . CAD S/P PCI mLAD & Cx-OM (Promus DES) 08/07/2014    mLAD & D2 100% - PCI to LAD w/ Promus P DES 2.25 x 20 & 8 (2.4), D2 Med Rx; Cx-OM tandem 80% -> PCI Promus P DES 2.75 x 16 & 3.0 x 24 (3 mm distal & 3.25 mm prox)  . Cardiomyopathy, ischemic-EF 40% by echo  09/01/2014    Echo: 8/30: Moderate LVH, EF roughly 40%. Pseudo-normal Gr2 DD, apical akinesis, moderate HK of apical inferior mid inferolateral and anterolateral as well as apical septal and apical lateral walls with mild HK of apical anterior   . Hx of stroke February 17 2010  . Essential hypertension, benign   . Dyslipidemia with high LDL and low HDL   . 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 Diabetic neuropathy associated with type 2 diabetes mellitus   . GERD (gastroesophageal reflux disease)   . Osteoarthritis of back     With chronic low back pain    Past Surgical History  Procedure Laterality Date  . Percutaneous coronary stent intervention (pci-s)  08/07/2014     PCI-mCx-OM2 tandem 80% - Promus P DES 2.75 mm x 16 mm and 3.0 mm x 24 mm (3 mm distal & 3.25 mm prox); Mid LAD 100% - 2 Overlapping Promus P DES 2.25 mm x 20 mm & 2.25 mm x 8 mm (2.4 mm) only TIMI2 apical flow, Occluded D2 (not intervened on)  .  Transthoracic echocardiogram  07/13/2014    Moderate LVH, EF roughly 40%. Pseudo-normal Gr2 DD, apical akinesis, moderate HK of apical inferior mid inferolateral and anterolateral as well as apical septal and apical lateral walls with mild HK of apical anterior  . Left heart catheterization with coronary angiogram N/A 08/07/2014    Procedure: LEFT HEART CATHETERIZATION WITH CORONARY ANGIOGRAM;  Surgeon: Marykay Lex, MD;  Location: [facility] CATH LAB;  Service: Cardiovascular;  Laterality: N/A;    History   Social History  . Marital Status: Married    Spouse Name: N/A  . Number of Children: N/A  . Years of Education: N/A   Occupational History  . Not on file.   Social History Main Topics  . Smoking status: Never Smoker   . Smokeless tobacco: Not on file  . Alcohol Use: No  . Drug Use: No  . Sexual Activity: Yes   Other Topics Concern  . Not on file   Social History Narrative   Married. Does not smoke -- never smoked.   He is currently doing cardiac rehabilitation    Current Outpatient Prescriptions on File Prior to Visit  Medication Sig Dispense Refill  . acetaminophen (TYLENOL) 500 MG tablet Take 1,000 mg by mouth every 6 (six) hours  as needed for moderate pain.    Marland Kitchen aspirin 81 MG chewable tablet Chew 1 tablet (81 mg total) by mouth daily.    Marland Kitchen atorvastatin (LIPITOR) 40 MG tablet Take 1 tablet (40 mg total) by mouth daily at 6 PM. 30 tablet 5  . carvedilol (COREG) 25 MG tablet Take 25 mg by mouth 2 (two) times daily with a meal.    . gabapentin (NEURONTIN) 600 MG tablet Take 1 tablet (600 mg total) by mouth 2 (two) times daily. 60 tablet 3  . glucose blood (RELION GLUCOSE TEST STRIPS) test strip 1 each by Other route 2 (two) times daily. And lancets 2/day 250.01 100 each 12  . Insulin Glargine (TOUJEO SOLOSTAR) 300 UNIT/ML SOPN Inject 120 Units into the skin every morning. And pen needles 1/day 9 pen 11  . lisinopril (PRINIVIL,ZESTRIL) 20 MG tablet Take 1 tablet (20 mg total) by  mouth daily. 30 tablet 3  . nitroGLYCERIN (NITROSTAT) 0.4 MG SL tablet Place 1 tablet (0.4 mg total) under the tongue every 5 (five) minutes as needed for chest pain. 25 tablet 2  . nystatin cream (MYCOSTATIN) Apply 1 application topically 2 (two) times daily. 30 g 0  . ranitidine (ZANTAC) 75 MG tablet Take 75 mg by mouth 2 (two) times daily as needed for heartburn.     . ticagrelor (BRILINTA) 90 MG TABS tablet Take 1 tablet (90 mg total) by mouth 2 (two) times daily. 60 tablet 10  . traMADol (ULTRAM) 50 MG tablet Take 1 tablet (50 mg total) by mouth every 6 (six) hours as needed. 16 tablet 0   No current facility-administered medications on file prior to visit.    No Known Allergies  Family History  Problem Relation Age of Onset  . Cancer Father   . Diabetes Neg Hx     BP 132/85 mmHg  Pulse 74  Temp(Src) 97.4 F (36.3 C) (Oral)  Ht  (1.803 m)  Wt 240 lb (108.863 kg)  BMI 33.49 kg/m2  SpO2 97%    Review of Systems He denies hypoglycemia.      Objective:   Physical Exam VITAL SIGNS:  See vs page GENERAL: no distress Pulses: dorsalis pedis intact bilat.   MSK: no deformity of the feet CV: trace bilat leg edema Skin:  no ulcer on the feet.  normal color and temp on the feet. Neuro: sensation is intact to touch on the feet.     A1c=13.4%    Assessment & Plan:  DM: glycemic contro is worse.   Noncompliance with cbg recording and insulin:  I'll work around this as best I can, but long-term prognosis is poor.    Patient is advised the following: Patient Instructions  check your blood sugar twice a day.  vary the time of day when you check, between before the 3 meals, and at bedtime.  also check if you have symptoms of your blood sugar being too high or too low.  please keep a record of the readings and bring it to your next appointment here.  You can write it on any piece of paper.  please call us sooner if your blood sugar goes below 70, or if you have a lot of  readings over 200.     It is really important that you get the insulin each day.   Please come back for a follow-up appointment in 3 months.

## 2015-06-16 ENCOUNTER — Other Ambulatory Visit (INDEPENDENT_AMBULATORY_CARE_PROVIDER_SITE_OTHER): Payer: BLUE CROSS/BLUE SHIELD

## 2015-06-16 DIAGNOSIS — E784 Other hyperlipidemia: Secondary | ICD-10-CM

## 2015-06-16 DIAGNOSIS — R0600 Dyspnea, unspecified: Secondary | ICD-10-CM | POA: Diagnosis not present

## 2015-06-16 DIAGNOSIS — E785 Hyperlipidemia, unspecified: Secondary | ICD-10-CM

## 2015-06-16 DIAGNOSIS — I10 Essential (primary) hypertension: Secondary | ICD-10-CM

## 2015-06-16 LAB — LIPID PANEL
CHOLESTEROL: 113 mg/dL (ref 0–200)
HDL: 27.3 mg/dL — AB (ref >39.00)
LDL CALC: 56 mg/dL (ref 0–99)
NonHDL: 85.7
TRIGLYCERIDES: 150 mg/dL — AB (ref 0.0–149.0)
Total CHOL/HDL Ratio: 4
VLDL: 30 mg/dL (ref 0.0–40.0)

## 2015-06-16 LAB — COMPREHENSIVE METABOLIC PANEL
ALBUMIN: 3.5 g/dL (ref 3.5–5.2)
ALK PHOS: 83 U/L (ref 39–117)
ALT: 16 U/L (ref 0–53)
AST: 14 U/L (ref 0–37)
BILIRUBIN TOTAL: 0.4 mg/dL (ref 0.2–1.2)
BUN: 24 mg/dL — AB (ref 6–23)
CALCIUM: 9.3 mg/dL (ref 8.4–10.5)
CO2: 29 meq/L (ref 19–32)
CREATININE: 1.34 mg/dL (ref 0.40–1.50)
Chloride: 106 mEq/L (ref 96–112)
GFR: 59.96 mL/min — ABNORMAL LOW (ref >60.00)
Glucose, Bld: 185 mg/dL — ABNORMAL HIGH (ref 70–99)
Potassium: 3.8 mEq/L (ref 3.5–5.1)
SODIUM: 141 meq/L (ref 135–145)
Total Protein: 6.9 g/dL (ref 6.0–8.3)

## 2015-06-16 NOTE — Addendum Note (Signed)
Addended by: Eustace Quail on: 06/16/2015 08:15 AM   Modules accepted: Orders

## 2015-06-17 ENCOUNTER — Telehealth: Payer: Self-pay | Admitting: Medical

## 2015-06-17 LAB — PRO B NATRIURETIC PEPTIDE

## 2015-06-17 NOTE — Telephone Encounter (Signed)
Caller name: [employer] back number: [phone]   Reason for call:  As per [facility] did not receive the appropriate specimen (pro BNP) therefore can not process order. Please follow up

## 2015-06-18 NOTE — Addendum Note (Signed)
Addended by: Silvio Pate D on: 06/18/2015 01:56 PM   Modules accepted: Orders

## 2015-06-19 LAB — PRO B NATRIURETIC PEPTIDE: Pro B Natriuretic peptide (BNP): 1735 pg/mL — ABNORMAL HIGH (ref <126)

## 2015-06-23 ENCOUNTER — Ambulatory Visit (INDEPENDENT_AMBULATORY_CARE_PROVIDER_SITE_OTHER): Payer: BLUE CROSS/BLUE SHIELD | Admitting: Medical

## 2015-06-23 ENCOUNTER — Ambulatory Visit (HOSPITAL_BASED_OUTPATIENT_CLINIC_OR_DEPARTMENT_OTHER)
Admission: RE | Admit: 2015-06-23 | Discharge: 2015-06-23 | Disposition: A | Discharge status: Home / Self Care | Payer: BLUE CROSS/BLUE SHIELD | Source: Ambulatory Visit | Attending: Medical | Admitting: Medical

## 2015-06-23 ENCOUNTER — Encounter: Payer: Self-pay | Admitting: Medical

## 2015-06-23 VITALS — BP 151/91 | HR 86 | Temp 98.0°F | Ht 71.0 in | Wt 231.0 lb

## 2015-06-23 DIAGNOSIS — R0602 Shortness of breath: Secondary | ICD-10-CM | POA: Diagnosis present

## 2015-06-23 DIAGNOSIS — I517 Cardiomegaly: Secondary | ICD-10-CM | POA: Diagnosis not present

## 2015-06-23 DIAGNOSIS — I255 Ischemic cardiomyopathy: Secondary | ICD-10-CM | POA: Diagnosis not present

## 2015-06-23 DIAGNOSIS — L600 Ingrowing nail: Secondary | ICD-10-CM | POA: Diagnosis not present

## 2015-06-23 MED ORDER — SULFAMETHOXAZOLE-TRIMETHOPRIM 800-160 MG PO TABS: 1.0000 | ORAL_TABLET | Freq: Two times a day (BID) | ORAL | Status: DC

## 2015-06-23 NOTE — Progress Notes (Signed)
Subjective:    Patient ID: Jonathan Clayton, male    DOB: 18-Feb-1966, 49 y.o.   MRN: [id number]  HPI   Pt in for follow up on mild bnp elevation and mild slight pedal edema. His cxr showed no effusion but stable cardiomegaly. Pt sees cardiologist august 12th. Mild dyspnea walking. Lying flat on back sometimes dyspnea.  Lt foot- Great toe about 3 wks of swollen medial aspect. 3 wks ago he trimmed the medial aspect of the nail. Bled that day and occasionally since then. No pain but diabetic. Pt prefers next week early. He has [employer] and plans over weekend.  Advised start taking his meds. Just not on today. Stressed take his insulin.   Review of Systems  Constitutional: Negative for fever, chills and fatigue.  Respiratory: Positive for shortness of breath. Negative for cough, chest tightness and wheezing.        Mld intermittent see hop.  Cardiovascular: Negative for chest pain and palpitations.  Genitourinary: Negative for dysuria and flank pain.  Musculoskeletal: Negative for back pain.  Skin:       Lt great toe ingown nail and red.   Neurological: Negative for dizziness and headaches.  Psychiatric/Behavioral: Negative for behavioral problems and confusion.   Past Medical History  Diagnosis Date  . STEMI of inferolateral wall- 08/07/14 08/07/2014  . CAD S/P PCI mLAD & Cx-OM (Promus DES) 08/07/2014    mLAD & D2 100% - PCI to LAD w/ Promus P DES 2.25 x 20 & 8 (2.4), D2 Med Rx; Cx-OM tandem 80% -> PCI Promus P DES 2.75 x 16 & 3.0 x 24 (3 mm distal & 3.25 mm prox)  . Cardiomyopathy, ischemic-EF 40% by echo  09/01/2014    Echo: 8/30: Moderate LVH, EF roughly 40%. Pseudo-normal Gr2 DD, apical akinesis, moderate HK of apical inferior mid inferolateral and anterolateral as well as apical septal and apical lateral walls with mild HK of apical anterior   . Hx of stroke February 17 2010  . Essential hypertension, benign   . Dyslipidemia with high LDL and low HDL   . Type 2 DM with CVD and  neuropathy, uncontrolled   . Diabetic neuropathy associated with type 2 diabetes mellitus   . GERD (gastroesophageal reflux disease)   . Osteoarthritis of back     With chronic low back pain    History   Social History  . Marital Status: Married    Spouse Name: N/A  . Number of Children: N/A  . Years of Education: N/A   Occupational History  . Not on file.   Social History Main Topics  . Smoking status: Never Smoker   . Smokeless tobacco: Not on file  . Alcohol Use: No  . Drug Use: No  . Sexual Activity: Yes   Other Topics Concern  . Not on file   Social History Narrative   Married. Does not smoke -- never smoked.   He is currently doing cardiac rehabilitation    Past Surgical History  Procedure Laterality Date  . Percutaneous coronary stent intervention (pci-s)  08/07/2014     PCI-mCx-OM2 tandem 80% - Promus P DES 2.75 mm x 16 mm and 3.0 mm x 24 mm (3 mm distal & 3.25 mm prox); Mid LAD 100% - 2 Overlapping Promus P DES 2.25 mm x 20 mm & 2.25 mm x 8 mm (2.4 mm) only TIMI2 apical flow, Occluded D2 (not intervened on)  . Transthoracic echocardiogram  07/13/2014    Moderate LVH,  EF roughly 40%. Pseudo-normal Gr2 DD, apical akinesis, moderate HK of apical inferior mid inferolateral and anterolateral as well as apical septal and apical lateral walls with mild HK of apical anterior  . Left heart catheterization with coronary angiogram N/A 08/07/2014    Procedure: LEFT HEART CATHETERIZATION WITH CORONARY ANGIOGRAM;  Surgeon: Marykay Lex, MD;  Location: [facility] CATH LAB;  Service: Cardiovascular;  Laterality: N/A;    Family History  Problem Relation Age of Onset  . Cancer Father   . Diabetes Neg Hx     No Known Allergies  Current Outpatient Prescriptions on File Prior to Visit  Medication Sig Dispense Refill  . acetaminophen (TYLENOL) 500 MG tablet Take 1,000 mg by mouth every 6 (six) hours as needed for moderate pain.    Marland Kitchen aspirin 81 MG chewable tablet Chew 1 tablet (81  mg total) by mouth daily. (Patient not taking: Reported on 06/23/2015)    . atorvastatin (LIPITOR) 40 MG tablet Take 1 tablet (40 mg total) by mouth daily at 6 PM. (Patient not taking: Reported on 06/23/2015) 30 tablet 5  . carvedilol (COREG) 25 MG tablet Take 25 mg by mouth 2 (two) times daily with a meal.    . gabapentin (NEURONTIN) 600 MG tablet Take 1 tablet (600 mg total) by mouth 2 (two) times daily. (Patient not taking: Reported on 06/23/2015) 60 tablet 3  . glucose blood (RELION GLUCOSE TEST STRIPS) test strip 1 each by Other route 2 (two) times daily. And lancets 2/day 250.01 (Patient not taking: Reported on 06/23/2015) 100 each 12  . Insulin Glargine (TOUJEO SOLOSTAR) 300 UNIT/ML SOPN Inject 120 Units into the skin every morning. And pen needles 1/day (Patient not taking: Reported on 06/23/2015) 9 pen 11  . lisinopril (PRINIVIL,ZESTRIL) 20 MG tablet Take 1 tablet (20 mg total) by mouth daily. (Patient not taking: Reported on 06/23/2015) 30 tablet 3  . nitroGLYCERIN (NITROSTAT) 0.4 MG SL tablet Place 1 tablet (0.4 mg total) under the tongue every 5 (five) minutes as needed for chest pain. (Patient not taking: Reported on 06/23/2015) 25 tablet 2  . nystatin cream (MYCOSTATIN) Apply 1 application topically 2 (two) times daily. (Patient not taking: Reported on 06/23/2015) 30 g 0  . ranitidine (ZANTAC) 75 MG tablet Take 75 mg by mouth 2 (two) times daily as needed for heartburn.     . ticagrelor (BRILINTA) 90 MG TABS tablet Take 1 tablet (90 mg total) by mouth 2 (two) times daily. (Patient not taking: Reported on 06/23/2015) 60 tablet 10  . traMADol (ULTRAM) 50 MG tablet Take 1 tablet (50 mg total) by mouth every 6 (six) hours as needed. (Patient not taking: Reported on 06/23/2015) 16 tablet 0   No current facility-administered medications on file prior to visit.    BP 151/91 mmHg  Pulse 86  Temp(Src) 98 F (36.7 C) (Oral)  Ht 5\' 11"  (1.803 m)  Wt 231 lb (104.781 kg)  BMI 32.23 kg/m2  SpO2  98%       Objective:   Physical Exam  General- No acute distress. Pleasant patient. Neck- Full range of motion, no jvd Lungs- Clear, even and unlabored. Heart- regular rate and rhythm. Neurologic- CNII- XII grossly intact.   Lower ext- 1+ pedal edema. Neg homans signs. calfs symmetric.  Lt great toe- ingrown toenail With redness medial aspect of toe and redness at base. Toe is not tender.      Assessment & Plan:

## 2015-06-23 NOTE — Patient Instructions (Addendum)
Ingrown toenail infection in diabetic. Rx bactrim ds. Referal to podiatrist for hopefully Monday. If area worsens dark tissue, expanding redness welling then ED evaluation.  Cardiomyopathy, ischemic-EF 40% by echo  With mild pnd. Mild dyspnea and elevated bnp recently. Will get cxr and bnp repeat today. If stable will advise keep august appointment with cardiology. If worse will try to get sooner appointment. Advise pt to start to watch weight and if pedale edema worsens let us know.    Follow up as previously scheduled or as needed.  Pt offered appointment today by podiatrist but he declined.

## 2015-06-23 NOTE — Assessment & Plan Note (Signed)
With mild pnd. Mild dyspnea and elevated bnp recently. Will get cxr and bnp repeat today. If stable will advise keep august appointment with cardiology. If worse will try to get sooner appointment. Advise pt to start to watch weight and if pedale edema worsens let us know.

## 2015-06-23 NOTE — Progress Notes (Signed)
before

## 2015-06-24 LAB — PRO B NATRIURETIC PEPTIDE: Pro B Natriuretic peptide (BNP): 1518 pg/mL — ABNORMAL HIGH (ref <126)

## 2015-06-30 ENCOUNTER — Encounter: Payer: Self-pay | Admitting: Medical

## 2015-06-30 ENCOUNTER — Telehealth: Payer: Self-pay | Admitting: Medical

## 2015-06-30 NOTE — Telephone Encounter (Signed)
See note I wrote regarding patient refusing to go to ER.

## 2015-06-30 NOTE — Progress Notes (Unsigned)
Received copy of lab report reflecting Critical Glucose results. Given to ES for viewing.

## 2015-06-30 NOTE — Progress Notes (Unsigned)
Per lab patient BS 537. Per ES called patient and asked to go to ED for evaluation,insulin and possible hydration via IV. Patient started he had not taken Insulin for the day when the level was recorde on yeaterday. States he has since taken insulin and it has come down to 346. Says he cannot go to ER at this time but if BS rises he will consider going.

## 2015-06-30 NOTE — Telephone Encounter (Signed)
Got critical value from podiatrist office. Pt bs was 537. Will get lpn to call pt and notify pt of this result. Based on critical value over 500 recommend ED evaluation. Iv hydration and bring down in ED. Will get lpn to call pt and advise this.

## 2015-07-01 NOTE — Telephone Encounter (Signed)
Note to lpn-Regarding his labs  Yesterday from podiatrist office. Will you get copy and send to pt endocrinologist.

## 2015-07-02 ENCOUNTER — Telehealth: Payer: Self-pay | Admitting: Medical

## 2015-07-02 NOTE — Telephone Encounter (Signed)
Will send Dr. Bradly Chris mail update on pt recent blood sugar levels on day he was podiatrist.

## 2015-07-02 NOTE — Telephone Encounter (Signed)
I havent seen these. If they were placed in the scan bin, they have already been sent for scanning.

## 2015-07-04 NOTE — Telephone Encounter (Signed)
Opened to review when his bs was up at podiatrist referral.

## 2015-08-06 ENCOUNTER — Ambulatory Visit (INDEPENDENT_AMBULATORY_CARE_PROVIDER_SITE_OTHER): Payer: BLUE CROSS/BLUE SHIELD | Admitting: Cardiology

## 2015-08-06 ENCOUNTER — Encounter: Payer: Self-pay | Admitting: Cardiology

## 2015-08-06 VITALS — BP 122/94 | HR 70 | Ht 71.0 in | Wt 240.0 lb

## 2015-08-06 DIAGNOSIS — E669 Obesity, unspecified: Secondary | ICD-10-CM

## 2015-08-06 DIAGNOSIS — Z9861 Coronary angioplasty status: Secondary | ICD-10-CM

## 2015-08-06 DIAGNOSIS — I10 Essential (primary) hypertension: Secondary | ICD-10-CM

## 2015-08-06 DIAGNOSIS — I2510 Atherosclerotic heart disease of native coronary artery without angina pectoris: Secondary | ICD-10-CM

## 2015-08-06 DIAGNOSIS — I255 Ischemic cardiomyopathy: Secondary | ICD-10-CM | POA: Diagnosis not present

## 2015-08-06 DIAGNOSIS — I5032 Chronic diastolic (congestive) heart failure: Secondary | ICD-10-CM

## 2015-08-06 DIAGNOSIS — E1165 Type 2 diabetes mellitus with hyperglycemia: Secondary | ICD-10-CM

## 2015-08-06 DIAGNOSIS — IMO0002 Reserved for concepts with insufficient information to code with codable children: Secondary | ICD-10-CM

## 2015-08-06 DIAGNOSIS — E784 Other hyperlipidemia: Secondary | ICD-10-CM

## 2015-08-06 DIAGNOSIS — E118 Type 2 diabetes mellitus with unspecified complications: Secondary | ICD-10-CM

## 2015-08-06 DIAGNOSIS — E785 Hyperlipidemia, unspecified: Secondary | ICD-10-CM

## 2015-08-06 MED ORDER — CLOPIDOGREL BISULFATE 75 MG PO TABS: 75.0000 mg | ORAL_TABLET | Freq: Every day | ORAL | Status: DC

## 2015-08-06 MED ORDER — FUROSEMIDE 40 MG PO TABS: 40.0000 mg | ORAL_TABLET | Freq: Every day | ORAL | Status: DC

## 2015-08-06 MED ORDER — FUROSEMIDE 20 MG PO TABS: 20.0000 mg | ORAL_TABLET | Freq: Every day | ORAL | Status: DC

## 2015-08-06 NOTE — Progress Notes (Signed)
PCP: Esperanza Richters, PA-C  Clinic Note: Chief Complaint  Patient presents with  . 6 months    Patient has felt light headed, SOB, and swelling in his feet and legs.  . Coronary Artery Disease    HPI: Jonathan Clayton is a 49 y.o. male with a PMH below who presents today for 4 month visits following his MI with PCI. He was found to have moderate ischemic cardiomyopathy by echocardiogram. I last saw him in February 2016:  He was still noticing mood exertional dyspnea, but not any anginal symptoms with rest or exertion. It seemed that his EF would probably improve. He was therefore evaluated with a ALT echocardiogram.  Studies reviewed:  Echo in 02/2015 -  mild concentric hypertrophy. Systolic function was normal. The estimated ejection fraction was in the range of 55% to 60%. There is dyskinesis of the apical myocardium. Doppler parameters are consistent with abnormal left ventricular relaxation (grade 1 diastolic dysfunction). Doppler parameters are consistent with high ventricular filling pressure  CXR 06/23/2015: IMPRESSION:  Stable cardiomegaly. No acute pulmonary disease.  Past Medical History  Diagnosis Date  . STEMI of inferolateral wall- 08/07/14 08/07/2014  . CAD S/P PCI mLAD & Cx-OM (Promus DES) 08/07/2014    mLAD & D2 100% - PCI to LAD w/ Promus P DES 2.25 x 20 & 8 (2.4), D2 Med Rx; Cx-OM tandem 80% -> PCI Promus P DES 2.75 x 16 & 3.0 x 24 (3 mm distal & 3.25 mm prox)  . Cardiomyopathy, ischemic-EF 40% by echo - Resolved  09/01/2014    Echo: 8/30: Moderate LVH, EF roughly 40%. Pseudo-normal Gr2 DD, apical akinesis, moderate HK of apical inferior mid inferolateral and anterolateral as well as apical septal and apical lateral walls with mild HK of apical anterior   . Chronic diastolic heart failure     Echo 02/2015: Mild Conc LVH - Gr 1 DD with high filling pressures, EF 55-60% dyskinesis of the apical myocardium  . Essential hypertension, benign   . Dyslipidemia with high LDL and low  HDL   . 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 Diabetic neuropathy associated with type 2 diabetes mellitus   . GERD (gastroesophageal reflux disease)   . Osteoarthritis of back     With chronic low back pain  . Hx of completed stroke February 17 2010    Prior Cardiac Evaluation and Past Surgical History: Past Surgical History  Procedure Laterality Date  . Percutaneous coronary stent intervention (pci-s)  08/07/2014     PCI-mCx-OM2 tandem 80% - Promus P DES 2.75 mm x 16 mm and 3.0 mm x 24 mm (3 mm distal & 3.25 mm prox); Mid LAD 100% - 2 Overlapping Promus P DES 2.25 mm x 20 mm & 2.25 mm x 8 mm (2.4 mm) only TIMI2 apical flow, Occluded D2 (not intervened on)  . Left heart catheterization with coronary angiogram N/A 08/07/2014    Procedure: LEFT HEART CATHETERIZATION WITH CORONARY ANGIOGRAM;  Surgeon: Marykay Lex, MD;  Location: [facility] CATH LAB;  Service: Cardiovascular;  Laterality: N/A;  . Transthoracic echocardiogram  07/13/2014; 02/2015    Moderate LVH, EF roughly 40%. Pseudo-normal Gr2 DD, apical akinesis, moderate HK of apical inferior mid inferolateral and anterolateral as well as apical septal and apical lateral walls with mild HK of apical anterior;; f/u: EF 55-60%, Mild LVH, Apical dyskinesis, Gr 1 DD with High Filling Pressures.    Interval History: He seems to be doing okay, but is  not back to his routine levels of what he says it used to be. He is not been working since July, having lost his job. He seems to be quite depressed about this. Currently can undergo evaluation for disability. He says occasionally he feels his heart rate go really fast sometimes dyspnea sitting up. There is no real primaries into this and it may happen once a week all once a month. He can eat more frequently as well. He denies any chest tightness or pressure however and is less dyspneic with exertion. He does however have episodes of gasping for breath but did not seem to be associated with  exertion and is  more at rest.  No PND orthopnea but does have mild edema.  No syncope or near-syncope. No TIAs or sounds he gets symptoms.  He really hasn't noted much difference in the last week and has been off his ACE inhibitor. He has not had any bleeding complications from his Brilinta - but is having brief dyspneic spells.. He also notes problems with erectile dysfunction. He has a hard time maintaining erection.  ROS: A comprehensive was performed. Review of Systems  Respiratory: Positive for sputum production.   Cardiovascular: Positive for claudication (Claudication versus neuropathy pain).  Gastrointestinal: Negative for blood in stool and melena.  Genitourinary: Negative for hematuria.  Musculoskeletal: Positive for back pain and joint pain.  Neurological: Positive for dizziness. Negative for headaches.       Neuropathy pain in his feet  Psychiatric/Behavioral: The patient is nervous/anxious and has insomnia.   All other systems reviewed and are negative.   Current Outpatient Prescriptions on File Prior to Visit  Medication Sig Dispense Refill  . acetaminophen (TYLENOL) 500 MG tablet Take 1,000 mg by mouth every 6 (six) hours as needed for moderate pain.    Marland Kitchen aspirin 81 MG chewable tablet Chew 1 tablet (81 mg total) by mouth daily.    . carvedilol (COREG) 25 MG tablet Take 25 mg by mouth 2 (two) times daily with a meal.    . gabapentin (NEURONTIN) 600 MG tablet Take 1 tablet (600 mg total) by mouth 2 (two) times daily. 60 tablet 3  . glucose blood (RELION GLUCOSE TEST STRIPS) test strip 1 each by Other route 2 (two) times daily. And lancets 2/day 250.01 100 each 12  . Insulin Glargine (TOUJEO SOLOSTAR) 300 UNIT/ML SOPN Inject 120 Units into the skin every morning. And pen needles 1/day 9 pen 11  . lisinopril (PRINIVIL,ZESTRIL) 20 MG tablet Take 1 tablet (20 mg total) by mouth daily. 30 tablet 3  . nitroGLYCERIN (NITROSTAT) 0.4 MG SL tablet Place 1 tablet (0.4 mg total) under  the tongue every 5 (five) minutes as needed for chest pain. 25 tablet 2  . nystatin cream (MYCOSTATIN) Apply 1 application topically 2 (two) times daily. 30 g 0  . ranitidine (ZANTAC) 75 MG tablet Take 75 mg by mouth 2 (two) times daily as needed for heartburn.     . sulfamethoxazole-trimethoprim (BACTRIM DS,SEPTRA DS) 800-160 MG per tablet Take 1 tablet by mouth 2 (two) times daily. 20 tablet 0  . traMADol (ULTRAM) 50 MG tablet Take 1 tablet (50 mg total) by mouth every 6 (six) hours as needed. 16 tablet 0  . atorvastatin (LIPITOR) 40 MG tablet Take 1 tablet (40 mg total) by mouth daily at 6 PM. (Patient not taking: Reported on 06/23/2015) 30 tablet 5   No current facility-administered medications on file prior to visit.   No Known Allergies  Social History  Substance Use Topics  . Smoking status: Never Smoker   . Smokeless tobacco: None  . Alcohol Use: No   currently unemployed. 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  Family History  Problem Relation Age of Onset  . Cancer Father   . Diabetes Neg Hx     Social History  Substance Use Topics  . Smoking status: Never Smoker   . Smokeless tobacco: None  . Alcohol Use: No   Family History  Problem Relation Age of Onset  . Cancer Father   . Diabetes Neg Hx      Wt Readings from Last 3 Encounters:  08/06/15 240 lb (108.863 kg)  06/23/15 231 lb (104.781 kg)  06/15/15 240 lb (108.863 kg)    PHYSICAL EXAM BP 122/94 mmHg  Pulse 70  Ht 5\' 11"  (1.803 m)  Wt 240 lb (108.863 kg)  BMI 33.49 kg/m2 General appearance: alert, cooperative, appears stated age, no distress and mildly obese Neck: no adenopathy, no carotid bruit and no JVD Lungs: CTAB, normal percussion bilaterally and non-labored Heart: RRR, S1&S2 normal, soft S4 no murmur, click, rub; nondisplaced PMI Abdomen: soft, non-tender; bowel sounds normal; no masses, no organomegaly; mild truncal obesity  Extremities: extremities  normal, atraumatic, no cyanosis, or edema Pulses: 2+ and symmetric;  Skin: normal and With the exception of the healing bug bite on his left calf.  Neurologic: Mental status: Alert, oriented, thought content appropriate Cranial nerves: normal (II-XII grossly intact)    Adult ECG Report - NSR - 70 bpm.  Ang MI, age undetermined (difficult to interpret due to artifact in Lead V2) - but less pronounced evolutionary changes of Anterior MI.  Poor R wave progression with persistent lateral TWI. Overall, improved EKG.  Recent Labs:   Lab Results  Component Value Date   CHOL 113 06/16/2015   HDL 27.30* 06/16/2015   LDLCALC 56 06/16/2015   TRIG 150.0* 06/16/2015   CHOLHDL 4 06/16/2015   Lab Results  Component Value Date   CREATININE 1.34 06/16/2015   Lab Results  Component Value Date   HGBA1C 13.4 06/15/2015   ProBNP 1518**  ASSESSMENT / PLAN: Problem List Items Addressed This Visit    CAD S/P PCI mLAD & Cx-OM (Promus DES) (Chronic)    Essentially he is one year out from his MI. He does have a diagonal lesion but not really having anginal symptoms. We'll continue to treat medically. Since he is having some dizzy spells which may be related to Alimta, and is having financial issues. We can convert him over to Plavix, once he completes his current prescription bottle of Brilinta. First dose of Plavix would be 150 mg. Again low threshold for Myoview stress test if he becomes more exertionally dyspneic or has recurrent anginal symptoms. With his diabetes, he may not have angina since he did not during his MI with the exception of the one episode. Continue carvedilol, ACE inhibitor and statin. Aggressive diabetic control by PCP.      Relevant Medications   furosemide (LASIX) 20 MG tablet   Other Relevant Orders   EKG 12-Lead   Cardiomyopathy, ischemic-EF 40% by echo  (Chronic)    He does have mild edema intermittent PND type symptoms.  EF notably improved by followup echo as reviewed  above.  He had a proBNP level checked back in July which was elevated. Not exactly sure what to do with this level since he  is not grossly showing signs of heart failure. He is on excellent cardiac medications with Cardura and beta blocker. Will at a dose Lasix.  Monitor daily weights and adjust based on scale      Relevant Medications   furosemide (LASIX) 20 MG tablet   Other Relevant Orders   EKG 12-Lead   Chronic diastolic heart failure    As noted in cardiomyopathy. We will start low-dose Lasix 20 mg daily. I've asked him to monitor daily weights. We may need to adjust his Lasix dosing according to weight changes. We talked with him about sliding scale dosing. It is possible that some of his dyspnea is related to winter. Can see how that changes once he switched to Plavix.      Relevant Medications   furosemide (LASIX) 20 MG tablet   Other Relevant Orders   EKG 12-Lead   Dyslipidemia with high LDL and low HDL (Chronic)    LDL looks much better. HDL still very low. Continue current dose of atorvastatin. May need to consider niacin, but exercise is the best method for increasing HDL.      Relevant Orders   EKG 12-Lead   Essential hypertension, benign - Primary (Chronic)    Well-controlled on current medications. No changes.      Relevant Medications   furosemide (LASIX) 20 MG tablet   Other Relevant Orders   EKG 12-Lead   Obesity (BMI 30-39.9) (Chronic)    We discussed the importance of a drastic change in diet and exercise. He probably needs nutrition consultation.      Type II diabetes mellitus with complication, uncontrolled (Chronic)    He is on pretty aggressive therapy. Monitor by endocrinology. Despite this his last A1c was 13.4. 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 Low threshold for evaluating lower extremity arterial Dopplers as well.        PATIENT'S TO GET RECORDS FROM MEDICAL RECORDS- SOCIAL SECURITY  DISABILITY.  Orders Placed This Encounter  Procedures  . EKG 12-Lead   Meds ordered this encounter  Medications  . DISCONTD: furosemide (LASIX) 40 MG tablet    Sig: Take 1 tablet (40 mg total) by mouth daily.    Dispense:  30 tablet    Refill:  11  . clopidogrel (PLAVIX) 75 MG tablet    Sig: Take 1 tablet (75 mg total) by mouth daily.    Dispense:  30 tablet    Refill:  11  . furosemide (LASIX) 20 MG tablet    Sig: Take 1 tablet (20 mg total) by mouth daily.    Dispense:  30 tablet    Refill:  11   Stop BRILINTA AFTER CURRENT BOTTLE.  START CLOPIDOGREL 75 MG DAILY- ON THE FIRST DAY OF TAKING MEDICATION TAKE 2 TABLETS.  LASIX (FUROSEMIDE)  20 MG ONE TABLET DAILY.  Sliding scale Lasix: Weigh yourself when you get home, then Daily in the Morning. Your dry weight will be what your scale says on the day you return home.(here is 240.5 lbs.).   If you gain more than 3 pounds from dry weight: Increase the Lasix dosing to 20 mg in the morning and 20mg  in the afternoon until weight returns to baseline dry weight.  If weight gain is greater than 5 pounds in 2 days: Increased to Lasix  20 mg twice a day and contact the office for further assistance if weight does not go down the next day.  If the  weight goes down more than 3 pounds from dry weight: Hold Lasix until it returns to baseline dry weight   Dr Herbie Baltimore recommends that you schedule a follow-up appointment in DEC/JAN 2016/2017    Marykay Lex, M.D., M.S. Interventional Cardiologist   Pager # [phone]

## 2015-08-06 NOTE — Patient Instructions (Addendum)
Stop BRILINTA AFTER CURRENT BOTTLE.  START CLOPIDOGREL 75 MG DAILY- ON THE FIRST DAY OF TAKING MEDICATION TAKE 2 TABLETS.  LASIX (FUROSEMIDE)  20 MG ONE TABLET DAILY.  Sliding scale Lasix: Weigh yourself when you get home, then Daily in the Morning. Your dry weight will be what your scale says on the day you return home.(here is 240.5 lbs.).   If you gain more than 3 pounds from dry weight: Increase the Lasix dosing to 20 mg in the morning and 20mg  in the afternoon until weight returns to baseline dry weight.  If weight gain is greater than 5 pounds in 2 days: Increased to Lasix  20 mg twice a day and contact the office for further assistance if weight does not go down the next day.  If the weight goes down more than 3 pounds from dry weight: Hold Lasix until it returns to baseline dry weight   Dr Herbie Baltimore recommends that you schedule a follow-up appointment in DEC/JAN 2016/2017  PATIENT'S TO GET RECORDS FROM MEDICAL RECORDS- SOCIAL SECURITY DISABILITY.

## 2015-08-08 ENCOUNTER — Encounter: Payer: Self-pay | Admitting: Cardiology

## 2015-08-08 DIAGNOSIS — I5032 Chronic diastolic (congestive) heart failure: Secondary | ICD-10-CM | POA: Insufficient documentation

## 2015-08-08 NOTE — Assessment & Plan Note (Signed)
As noted in cardiomyopathy. We will start low-dose Lasix 20 mg daily. I've asked him to monitor daily weights. We may need to adjust his Lasix dosing according to weight changes. We talked with him about sliding scale dosing. It is possible that some of his dyspnea is related to winter. Can see how that changes once he switched to Plavix.

## 2015-08-08 NOTE — Assessment & Plan Note (Signed)
He is on pretty aggressive therapy. Monitor by endocrinology. Despite this his last A1c was 13.4. 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 Low threshold for evaluating lower extremity arterial Dopplers as well.

## 2015-08-08 NOTE — Assessment & Plan Note (Signed)
LDL looks much better. HDL still very low. Continue current dose of atorvastatin. May need to consider niacin, but exercise is the best method for increasing HDL.

## 2015-08-08 NOTE — Assessment & Plan Note (Signed)
Well-controlled on current medications. No changes 

## 2015-08-08 NOTE — Assessment & Plan Note (Signed)
Essentially he is one year out from his MI. He does have a diagonal lesion but not really having anginal symptoms. We'll continue to treat medically. Since he is having some dizzy spells which may be related to Alimta, and is having financial issues. We can convert him over to Plavix, once he completes his current prescription bottle of Brilinta. First dose of Plavix would be 150 mg. Again low threshold for Myoview stress test if he becomes more exertionally dyspneic or has recurrent anginal symptoms. With his diabetes, he may not have angina since he did not during his MI with the exception of the one episode. Continue carvedilol, ACE inhibitor and statin. Aggressive diabetic control by PCP.

## 2015-08-08 NOTE — Assessment & Plan Note (Signed)
We discussed the importance of a drastic change in diet and exercise. He probably needs nutrition consultation.

## 2015-08-08 NOTE — Assessment & Plan Note (Signed)
He does have mild edema intermittent PND type symptoms.  EF notably improved by followup echo as reviewed above.  He had a proBNP level checked back in July which was elevated. Not exactly sure what to do with this level since he is not grossly showing signs of heart failure. He is on excellent cardiac medications with Cardura and beta blocker. Will at a dose Lasix.  Monitor daily weights and adjust based on scale

## 2015-09-08 IMAGING — CR DG CHEST 1V PORT
1 series · 1 of 1 positions shown · non-contrast
Comparison: None.

CLINICAL DATA: Chest pain

EXAM:
PORTABLE CHEST - 1 VIEW

[view not recorded]
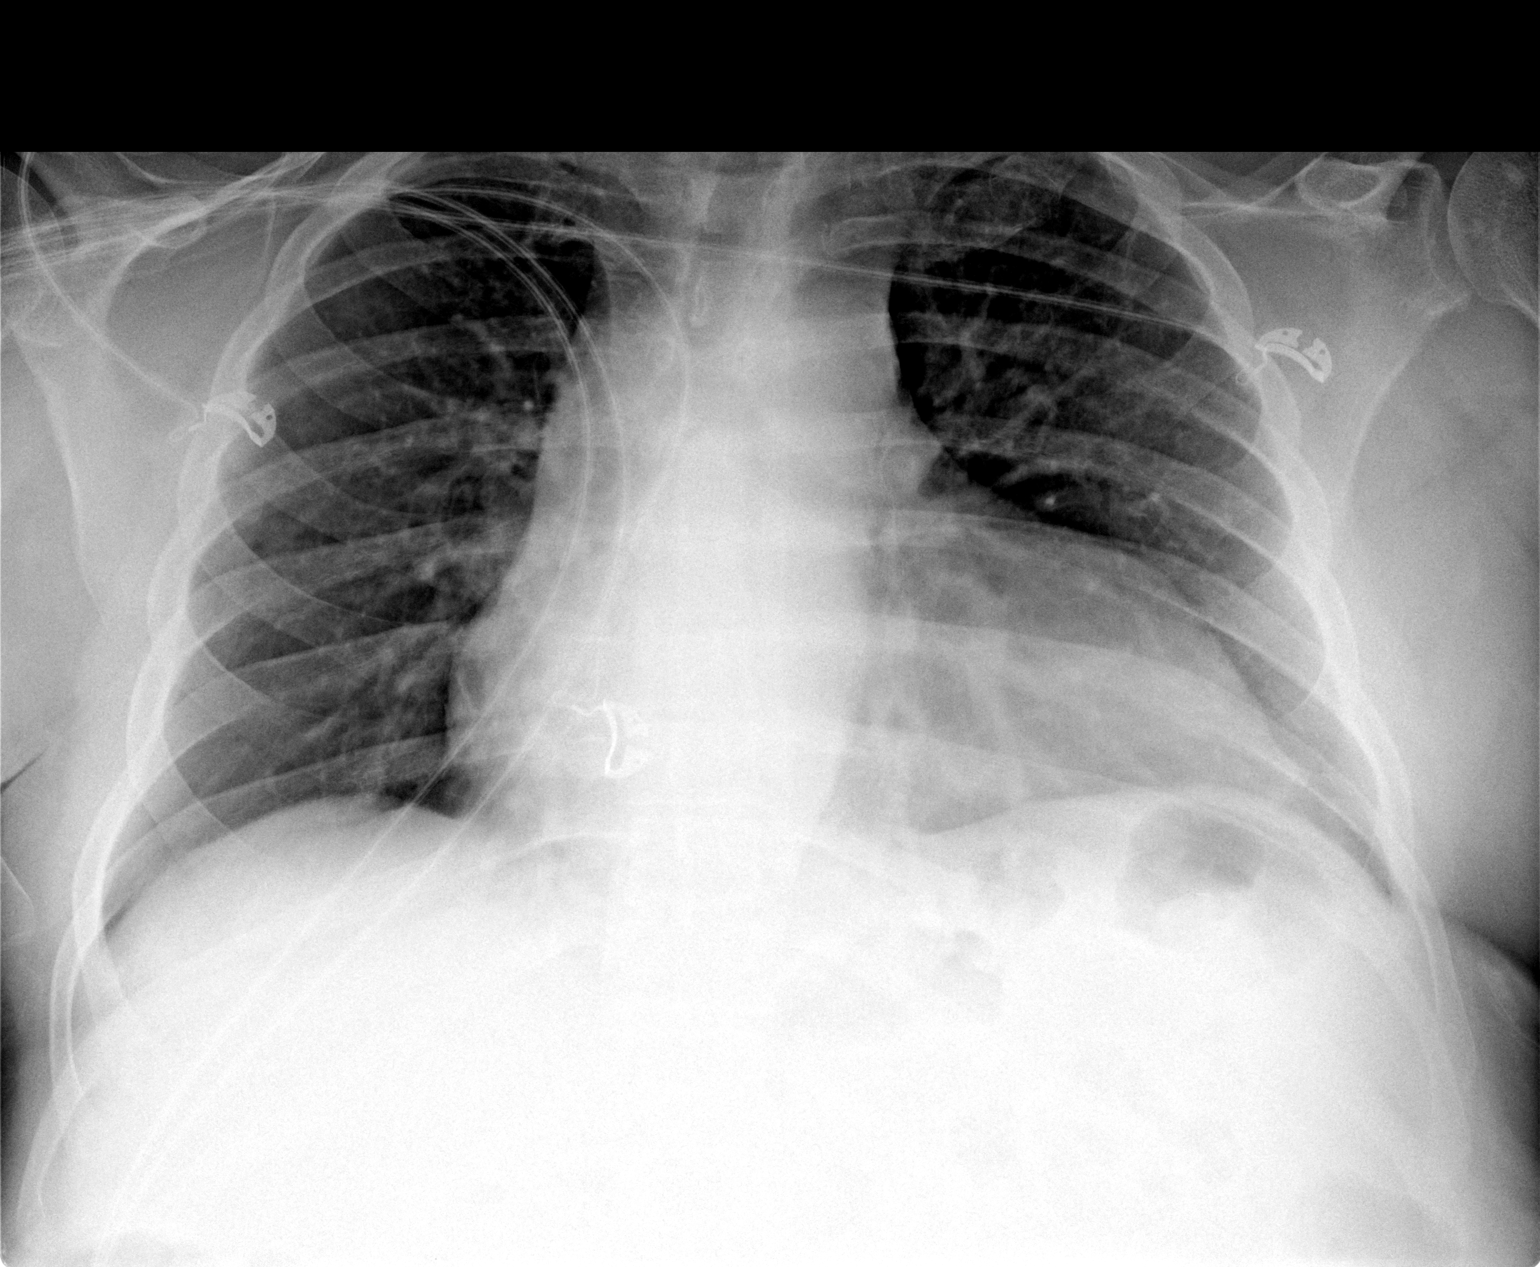

[1 of 1 positions shown; findings below may reference images not displayed]

FINDINGS: Cardiac silhouette is normal in size. Normal mediastinal and hilar
contours. Clear lungs. No pleural effusion or pneumothorax.

The bony thorax is intact.
IMPRESSION: No active disease.

## 2015-09-09 ENCOUNTER — Ambulatory Visit: Payer: BLUE CROSS/BLUE SHIELD | Admitting: Medical

## 2015-09-14 ENCOUNTER — Ambulatory Visit (INDEPENDENT_AMBULATORY_CARE_PROVIDER_SITE_OTHER): Payer: BLUE CROSS/BLUE SHIELD | Admitting: Medical

## 2015-09-14 ENCOUNTER — Encounter: Payer: Self-pay | Admitting: Medical

## 2015-09-14 VITALS — BP 126/80 | HR 81 | Temp 97.8°F | Ht 71.0 in | Wt 238.0 lb

## 2015-09-14 DIAGNOSIS — I10 Essential (primary) hypertension: Secondary | ICD-10-CM | POA: Diagnosis not present

## 2015-09-14 DIAGNOSIS — M545 Low back pain: Secondary | ICD-10-CM

## 2015-09-14 DIAGNOSIS — E785 Hyperlipidemia, unspecified: Secondary | ICD-10-CM

## 2015-09-14 DIAGNOSIS — E1165 Type 2 diabetes mellitus with hyperglycemia: Secondary | ICD-10-CM

## 2015-09-14 DIAGNOSIS — I255 Ischemic cardiomyopathy: Secondary | ICD-10-CM

## 2015-09-14 DIAGNOSIS — Z794 Long term (current) use of insulin: Secondary | ICD-10-CM

## 2015-09-14 NOTE — Patient Instructions (Addendum)
Continue lasix and plavix cardiologist recent wrote for your cardimyopathy. Be aware of weight gain with pedal edema and shortness of breath as complication with cardiomyopathy. Not case presently.  Your blood pressure is well controlled. Continue current regimen.  For your diabetes will get a1-c since I don't see one done recently. Better control of BS can be related to your weight gain. Would recommend getting daily exercise. I agree with cardiologist recommendation that swimming may be beneficial since you have back and joint pain.   Will ask Ollen Gross to call you neurosurgeon office to see if you need other visit or if they would just order mri as you had that understanding.  Will put in future labs with cmp and lipid today. You should be on atorvastatin as long as no side effects. With your cardiac risk factors keeping cholesterol controlled is very important.  Follow up in 3 months or as needed.

## 2015-09-14 NOTE — Progress Notes (Signed)
Pre visit review using our clinic review tool, if applicable. No additional management support is needed unless otherwise documented below in the visit note. 

## 2015-09-14 NOTE — Progress Notes (Signed)
Subjective:    Patient ID: Jonathan Clayton, male    DOB: 08-29-1966, 48 y.o.   MRN: [id number]  HPI  Pt in for follow up.  Pt has CAD, cardiomyopathy and chf. Pt was put on lasix 20 mg and plavix as well. No sob, no pedal edema and no chest pain. History of MI in past.  Pt also has seen his endocrinologist. Pt states his toujeou is increasing his weight. He notes that he put on weight when his blood sugar was better controlled. Pt states he wants to be closer to 210. He states he does not overeat/stuff himself. 9 month ago a1-c was 11.9. Some elevated microalbumin was 2.5. Pt sees Dr. Everardo All. Pt stats fairly sure no a1c done in past 3 months.  Pt states difficult to exercise due to back pain. Also he has diabetic nerve pain.  Regarding his low back pain(not exacerbated today but this is more chronic problems.. Pt remind me he went to neurosurgeon. No mri was ordered but he states he was told by his office they would order. On review chart looks like [facility].   Pt states he stopped lipitor in the past although I did not recommend that. No side effects reported.  Review of Systems  Constitutional: Negative for fever, chills, diaphoresis, activity change and fatigue.  Respiratory: Negative for cough, chest tightness and shortness of breath.   Cardiovascular: Negative for chest pain, palpitations and leg swelling.  Gastrointestinal: Negative for nausea, vomiting and abdominal pain.  Musculoskeletal: Negative for neck pain and neck stiffness.  Neurological: Negative for dizziness, tremors, seizures, syncope, facial asymmetry, speech difficulty, weakness, light-headedness, numbness and headaches.  Psychiatric/Behavioral: Negative for behavioral problems, confusion and agitation. The patient is not nervous/anxious.     Past Medical History  Diagnosis Date  . STEMI of inferolateral wall- 08/07/14 08/07/2014  . CAD S/P PCI mLAD & Cx-OM (Promus DES) 08/07/2014    mLAD & D2 100% - PCI to  LAD w/ Promus P DES 2.25 x 20 & 8 (2.4), D2 Med Rx; Cx-OM tandem 80% -> PCI Promus P DES 2.75 x 16 & 3.0 x 24 (3 mm distal & 3.25 mm prox)  . Cardiomyopathy, ischemic-EF 40% by echo - Resolved  09/01/2014    Echo: 8/30: Moderate LVH, EF roughly 40%. Pseudo-normal Gr2 DD, apical akinesis, moderate HK of apical inferior mid inferolateral and anterolateral as well as apical septal and apical lateral walls with mild HK of apical anterior   . Chronic diastolic heart failure (HCC)     Echo 02/2015: Mild Conc LVH - Gr 1 DD with high filling pressures, EF 55-60% dyskinesis of the apical myocardium  . Essential hypertension, benign   . Dyslipidemia with high LDL and low HDL   . 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 GERD (gastroesophageal reflux disease)   . Osteoarthritis of back     With chronic low back pain  . Hx of completed stroke February 17 2010    Social History   Social History  . Marital Status: Married    Spouse Name: N/A  . Number of Children: N/A  . Years of Education: N/A   Occupational History  . Not on file.   Social History Main Topics  . Smoking status: Never Smoker   . Smokeless tobacco: Not on file  . Alcohol Use: No  . Drug Use: No  . Sexual Activity:  Yes   Other Topics Concern  . Not on file   Social History Narrative   Married. Does not smoke -- never smoked.   He is currently doing cardiac rehabilitation    Past Surgical History  Procedure Laterality Date  . Percutaneous coronary stent intervention (pci-s)  08/07/2014     PCI-mCx-OM2 tandem 80% - Promus P DES 2.75 mm x 16 mm and 3.0 mm x 24 mm (3 mm distal & 3.25 mm prox); Mid LAD 100% - 2 Overlapping Promus P DES 2.25 mm x 20 mm & 2.25 mm x 8 mm (2.4 mm) only TIMI2 apical flow, Occluded D2 (not intervened on)  . Left heart catheterization with coronary angiogram N/A 08/07/2014    Procedure: LEFT HEART CATHETERIZATION WITH  CORONARY ANGIOGRAM;  Surgeon: Marykay Lex, MD;  Location: [facility] CATH LAB;  Service: Cardiovascular;  Laterality: N/A;  . Transthoracic echocardiogram  07/13/2014; 02/2015    Moderate LVH, EF roughly 40%. Pseudo-normal Gr2 DD, apical akinesis, moderate HK of apical inferior mid inferolateral and anterolateral as well as apical septal and apical lateral walls with mild HK of apical anterior;; f/u: EF 55-60%, Mild LVH, Apical dyskinesis, Gr 1 DD with High Filling Pressures.    Family History  Problem Relation Age of Onset  . Cancer Father   . Diabetes Neg Hx     No Known Allergies  Current Outpatient Prescriptions on File Prior to Visit  Medication Sig Dispense Refill  . acetaminophen (TYLENOL) 500 MG tablet Take 1,000 mg by mouth every 6 (six) hours as needed for moderate pain.    Marland Kitchen aspirin 81 MG chewable tablet Chew 1 tablet (81 mg total) by mouth daily.    Marland Kitchen atorvastatin (LIPITOR) 40 MG tablet Take 1 tablet (40 mg total) by mouth daily at 6 PM. 30 tablet 5  . carvedilol (COREG) 25 MG tablet Take 25 mg by mouth 2 (two) times daily with a meal.    . clopidogrel (PLAVIX) 75 MG tablet Take 1 tablet (75 mg total) by mouth daily. 30 tablet 11  . furosemide (LASIX) 20 MG tablet Take 1 tablet (20 mg total) by mouth daily. 30 tablet 11  . gabapentin (NEURONTIN) 600 MG tablet Take 1 tablet (600 mg total) by mouth 2 (two) times daily. 60 tablet 3  . glucose blood (RELION GLUCOSE TEST STRIPS) test strip 1 each by Other route 2 (two) times daily. And lancets 2/day 250.01 100 each 12  . Insulin Glargine (TOUJEO SOLOSTAR) 300 UNIT/ML SOPN Inject 120 Units into the skin every morning. And pen needles 1/day 9 pen 11  . lisinopril (PRINIVIL,ZESTRIL) 20 MG tablet Take 1 tablet (20 mg total) by mouth daily. 30 tablet 3  . nitroGLYCERIN (NITROSTAT) 0.4 MG SL tablet Place 1 tablet (0.4 mg total) under the tongue every 5 (five) minutes as needed for chest pain. 25 tablet 2  . nystatin cream (MYCOSTATIN) Apply 1  application topically 2 (two) times daily. 30 g 0  . ranitidine (ZANTAC) 75 MG tablet Take 75 mg by mouth 2 (two) times daily as needed for heartburn.     . sulfamethoxazole-trimethoprim (BACTRIM DS,SEPTRA DS) 800-160 MG per tablet Take 1 tablet by mouth 2 (two) times daily. 20 tablet 0  . traMADol (ULTRAM) 50 MG tablet Take 1 tablet (50 mg total) by mouth every 6 (six) hours as needed. 16 tablet 0   No current facility-administered medications on file prior to visit.    BP 126/80 mmHg  Pulse 81  Temp(Src)  97.8 F (36.6 C) (Oral)  Ht  (1.803 m)  Wt 238 lb (107.956 kg)  BMI 33.21 kg/m2  SpO2 97%       Objective:   Physical Exam  General Mental Status- Alert. General Appearance- Not in acute distress.   Skin General: Color- Normal Color. Moisture- Normal Moisture.  Neck Carotid Arteries- Normal color. Moisture- Normal Moisture. No carotid bruits. No JVD.  Chest and Lung Exam Auscultation: Breath Sounds:-Normal. CTA.  Cardiovascular Auscultation:Rythm- Regular, Rate and Rhythm. Murmurs & Other Heart Sounds:Auscultation of the heart reveals- No Murmurs.  Abdomen Inspection:-Inspeection Normal. Palpation/Percussion:Note:No mass. Palpation and Percussion of the abdomen reveal- Non Tender, Non Distended + BS, no rebound or guarding.  Neurologic Cranial Nerve exam:- CN III-XII intact(No nystagmus), symmetric smile. Strength:- 5/5 equal and symmetric strength both upper and lower extremities.  Lower ext- no breakdown or ulcerations of feet. Lt foot. 1st metatarsal head area mild callous present.      Assessment & Plan:  Continue lasix and plavix cardiologist recent wrote for your cardimyopathy. Be aware of weight gain with pedal edema and shortness of breath as complication with cardiomyopathy. Not case presently.  Your blood pressure is well controlled. Continue current regimen.  For your diabetes will get a1-c since I don't see one done recently. Better control  of BS can be related to your weight gain. Would recommend getting daily exercise. I agree with cardiologist recommendation that swimming may be beneficial since you have back and joint pain.   Will ask Ollen Gross  to call you neurosurgeon office to see if you need other visit or if they would just order mri as you had that understanding.  Will put in future labs with cmp and lipid today. You should be on atorvastatin as long as no side effects. With your cardiac risk factors keeping cholesterol controlled is very important.  Follow up in 3 months or as needed.

## 2015-09-15 ENCOUNTER — Telehealth: Payer: Self-pay | Admitting: Medical

## 2015-09-15 ENCOUNTER — Other Ambulatory Visit (INDEPENDENT_AMBULATORY_CARE_PROVIDER_SITE_OTHER): Payer: BLUE CROSS/BLUE SHIELD

## 2015-09-15 DIAGNOSIS — Z794 Long term (current) use of insulin: Secondary | ICD-10-CM

## 2015-09-15 DIAGNOSIS — E785 Hyperlipidemia, unspecified: Secondary | ICD-10-CM

## 2015-09-15 DIAGNOSIS — E1165 Type 2 diabetes mellitus with hyperglycemia: Secondary | ICD-10-CM

## 2015-09-15 LAB — LIPID PANEL
Cholesterol: 203 mg/dL — ABNORMAL HIGH (ref 0–200)
HDL: 33.5 mg/dL — AB (ref >39.00)
NonHDL: 169.16
Total CHOL/HDL Ratio: 6
Triglycerides: 219 mg/dL — ABNORMAL HIGH (ref 0.0–149.0)
VLDL: 43.8 mg/dL — AB (ref 0.0–40.0)

## 2015-09-15 LAB — COMPREHENSIVE METABOLIC PANEL
ALBUMIN: 3.9 g/dL (ref 3.5–5.2)
ALT: 13 U/L (ref 0–53)
AST: 13 U/L (ref 0–37)
Alkaline Phosphatase: 82 U/L (ref 39–117)
BUN: 32 mg/dL — ABNORMAL HIGH (ref 6–23)
CHLORIDE: 102 meq/L (ref 96–112)
CO2: 28 mEq/L (ref 19–32)
Calcium: 9.6 mg/dL (ref 8.4–10.5)
Creatinine, Ser: 1.78 mg/dL — ABNORMAL HIGH (ref 0.40–1.50)
GFR: 43.16 mL/min — AB (ref >60.00)
Glucose, Bld: 255 mg/dL — ABNORMAL HIGH (ref 70–99)
POTASSIUM: 4.2 meq/L (ref 3.5–5.1)
Sodium: 138 mEq/L (ref 135–145)
Total Bilirubin: 0.4 mg/dL (ref 0.2–1.2)
Total Protein: 7.5 g/dL (ref 6.0–8.3)

## 2015-09-15 LAB — LDL CHOLESTEROL, DIRECT: Direct LDL: 134 mg/dL

## 2015-09-15 LAB — HEMOGLOBIN A1C: Hgb A1c MFr Bld: 12.5 % — ABNORMAL HIGH (ref 4.6–6.5)

## 2015-09-15 MED ORDER — ATORVASTATIN CALCIUM 40 MG PO TABS: 40.0000 mg | ORAL_TABLET | Freq: Every day | ORAL | Status: DC

## 2015-09-15 NOTE — Telephone Encounter (Signed)
Refilled his lipitor.

## 2015-09-16 ENCOUNTER — Other Ambulatory Visit: Payer: Self-pay | Admitting: Cardiology

## 2015-09-16 MED ORDER — CARVEDILOL 25 MG PO TABS: 25.0000 mg | ORAL_TABLET | Freq: Two times a day (BID) | ORAL | Status: DC

## 2015-09-16 NOTE — Addendum Note (Signed)
Addended by: Neldon Labella on: 09/16/2015 12:09 PM   Modules accepted: Orders

## 2015-10-18 ENCOUNTER — Ambulatory Visit: Payer: BLUE CROSS/BLUE SHIELD | Admitting: Endocrinology

## 2015-10-25 ENCOUNTER — Telehealth: Payer: Self-pay | Admitting: Medical

## 2015-10-25 NOTE — Telephone Encounter (Signed)
.  Patient has diarrhea and would like to be seen Wednesday at 5pm please advise what provider can see patient, i was advised to ask prior to scheduling.

## 2015-10-25 NOTE — Telephone Encounter (Signed)
Pt states he has had diarrhea x 1 week and wants an appointment as soon as possible. Advised pt to come in as quick as possible due to risk of dehydration. Pt is agreeable to come in tomorrow morning.

## 2015-10-26 ENCOUNTER — Ambulatory Visit (INDEPENDENT_AMBULATORY_CARE_PROVIDER_SITE_OTHER): Payer: BLUE CROSS/BLUE SHIELD | Admitting: Physician Assistant

## 2015-10-26 ENCOUNTER — Encounter: Payer: Self-pay | Admitting: Physician Assistant

## 2015-10-26 VITALS — BP 132/90 | HR 70 | Temp 97.7°F | Resp 16 | Ht 71.0 in | Wt 242.5 lb

## 2015-10-26 DIAGNOSIS — R195 Other fecal abnormalities: Secondary | ICD-10-CM | POA: Diagnosis not present

## 2015-10-26 NOTE — Assessment & Plan Note (Signed)
Mild. Suspect due to change in dosing of medication as this has been an issue before. Likely an IBS component as well. Exam negative. Fiber supplement and Align probiotic daily. Dietary measures reviewed. Be consistent with medication. Call or follow-up if not resolving.

## 2015-10-26 NOTE — Progress Notes (Signed)
Patient presents to clinic today c/o 1.5 weeks of increased gaseousness and bloating associated with loose stools occuring 3 times per day. Denies fever, chills, N/V, heart burn, abdominal pain, melena, hematochezia or tenesmus. Denies recent travel or sick contact. Endorses he recently changed timing of his Toujeo and typically when that happens he gets some mild stomach upset.  Past Medical History  Diagnosis Date  . STEMI of inferolateral wall- 08/07/14 08/07/2014  . CAD S/P PCI mLAD & Cx-OM (Promus DES) 08/07/2014    mLAD & D2 100% - PCI to LAD w/ Promus P DES 2.25 x 20 & 8 (2.4), D2 Med Rx; Cx-OM tandem 80% -> PCI Promus P DES 2.75 x 16 & 3.0 x 24 (3 mm distal & 3.25 mm prox)  . Cardiomyopathy, ischemic-EF 40% by echo - Resolved  09/01/2014    Echo: 8/30: Moderate LVH, EF roughly 40%. Pseudo-normal Gr2 DD, apical akinesis, moderate HK of apical inferior mid inferolateral and anterolateral as well as apical septal and apical lateral walls with mild HK of apical anterior   . Chronic diastolic heart failure (HCC)     Echo 02/2015: Mild Conc LVH - Gr 1 DD with high filling pressures, EF 55-60% dyskinesis of the apical myocardium  . Essential hypertension, benign   . Dyslipidemia with high LDL and low HDL   . 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 GERD (gastroesophageal reflux disease)   . Osteoarthritis of back     With chronic low back pain  . Hx of completed stroke February 17 2010    Current Outpatient Prescriptions on File Prior to Visit  Medication Sig Dispense Refill  . acetaminophen (TYLENOL) 500 MG tablet Take 1,000 mg by mouth every 6 (six) hours as needed for moderate pain.    Marland Kitchen aspirin 81 MG chewable tablet Chew 1 tablet (81 mg total) by mouth daily.    Marland Kitchen atorvastatin (LIPITOR) 40 MG tablet Take 1 tablet (40 mg total) by mouth daily at 6 PM. 30 tablet 5  . carvedilol (COREG) 25 MG tablet Take 1  tablet (25 mg total) by mouth 2 (two) times daily with a meal. 60 tablet 3  . clopidogrel (PLAVIX) 75 MG tablet Take 1 tablet (75 mg total) by mouth daily. 30 tablet 11  . furosemide (LASIX) 20 MG tablet Take 1 tablet (20 mg total) by mouth daily. 30 tablet 11  . gabapentin (NEURONTIN) 600 MG tablet Take 1 tablet (600 mg total) by mouth 2 (two) times daily. 60 tablet 3  . glucose blood (RELION GLUCOSE TEST STRIPS) test strip 1 each by Other route 2 (two) times daily. And lancets 2/day 250.01 100 each 12  . Insulin Glargine (TOUJEO SOLOSTAR) 300 UNIT/ML SOPN Inject 120 Units into the skin every morning. And pen needles 1/day 9 pen 11  . lisinopril (PRINIVIL,ZESTRIL) 20 MG tablet Take 1 tablet (20 mg total) by mouth daily. 30 tablet 3  . nitroGLYCERIN (NITROSTAT) 0.4 MG SL tablet Place 1 tablet (0.4 mg total) under the tongue every 5 (five) minutes as needed for chest pain. 25 tablet 2  . nystatin cream (MYCOSTATIN) Apply 1 application topically 2 (two) times daily. 30 g 0  . ranitidine (ZANTAC) 75 MG tablet Take 75 mg by mouth 2 (two) times daily as needed for heartburn.     . traMADol (ULTRAM) 50 MG tablet Take 1 tablet (50 mg total) by mouth  every 6 (six) hours as needed. 16 tablet 0   No current facility-administered medications on file prior to visit.    No Known Allergies  Family History  Problem Relation Age of Onset  . Cancer Father   . Diabetes Neg Hx     Social History   Social History  . Marital Status: Married    Spouse Name: N/A  . Number of Children: N/A  . Years of Education: N/A   Social History Main Topics  . Smoking status: Never Smoker   . Smokeless tobacco: None  . Alcohol Use: No  . Drug Use: No  . Sexual Activity: Yes   Other Topics Concern  . None   Social History Narrative   Married. Does not smoke -- never smoked.   He is currently doing cardiac rehabilitation   Review of Systems - See HPI.  All other ROS are negative.  BP 132/90 mmHg  Pulse 70   Temp(Src) 97.7 F (36.5 C) (Oral)  Resp 16  Ht  (1.803 m)  Wt 242 lb 8 oz (109.997 kg)  BMI 33.84 kg/m2  SpO2 98%  Physical Exam  Constitutional: He is well-developed, well-nourished, and in no distress.  HENT:  Head: Normocephalic and atraumatic.  Eyes: Conjunctivae are normal.  Cardiovascular: Normal rate, regular rhythm, normal heart sounds and intact distal pulses.   Pulmonary/Chest: Effort normal and breath sounds normal. No respiratory distress. He has no wheezes. He has no rales. He exhibits no tenderness.  Abdominal: Soft. Bowel sounds are normal. He exhibits no distension and no mass. There is no tenderness. There is no rebound and no guarding.  Skin: Skin is warm and dry. No rash noted.  Psychiatric: Affect normal.  Vitals reviewed.   Recent Results (from the past 2160 hour(s))  Comprehensive metabolic panel     Status: Abnormal   Collection Time: 09/15/15  7:57 AM  Result Value Ref Range   Sodium 138 135 - 145 mEq/L   Potassium 4.2 3.5 - 5.1 mEq/L   Chloride 102 96 - 112 mEq/L   CO2 28 19 - 32 mEq/L   Glucose, Bld 255 (H) 70 - 99 mg/dL   BUN 32 (H) 6 - 23 mg/dL   Creatinine, Ser 1.61 (H) 0.40 - 1.50 mg/dL   Total Bilirubin 0.4 0.2 - 1.2 mg/dL   Alkaline Phosphatase 82 39 - 117 U/L   AST 13 0 - 37 U/L   ALT 13 0 - 53 U/L   Total Protein 7.5 6.0 - 8.3 g/dL   Albumin 3.9 3.5 - 5.2 g/dL   Calcium 9.6 8.4 - 09.6 mg/dL   GFR 04.54 (L) >09.81 mL/min  Hemoglobin A1c     Status: Abnormal   Collection Time: 09/15/15  7:57 AM  Result Value Ref Range   Hgb A1c MFr Bld 12.5 (H) 4.6 - 6.5 %    Comment: Glycemic Control Guidelines for People with Diabetes:Non Diabetic:  <6%Goal of Therapy: <7%Additional Action Suggested:  >8%   Lipid panel     Status: Abnormal   Collection Time: 09/15/15  7:57 AM  Result Value Ref Range   Cholesterol 203 (H) 0 - 200 mg/dL    Comment: ATP III Classification       Desirable:  < 200 mg/dL               Borderline High:  200 - 239 mg/dL           High:  > = 191 mg/dL  Triglycerides 219.0 (H) 0.0 - 149.0 mg/dL    Comment: Normal:  <098 mg/dLBorderline High:  150 - 199 mg/dL   HDL 11.91 (L) >47.82 mg/dL   VLDL 95.6 (H) 0.0 - 21.3 mg/dL   Total CHOL/HDL Ratio 6     Comment:                Men          Women1/2 Average Risk     3.4          3.3Average Risk          5.0          4.42X Average Risk          9.6          7.13X Average Risk          15.0          11.0                       NonHDL 169.16     Comment: NOTE:  Non-HDL goal should be 30 mg/dL higher than patient's LDL goal (i.e. LDL goal of < 70 mg/dL, would have non-HDL goal of < 100 mg/dL)  LDL cholesterol, direct     Status: None   Collection Time: 09/15/15  7:57 AM  Result Value Ref Range   Direct LDL 134.0 mg/dL    Comment: Optimal:  <086 mg/dLNear or Above Optimal:  100-129 mg/dLBorderline High:  130-159 mg/dLHigh:  160-189 mg/dLVery High:  >190 mg/dL    Assessment/Plan: Loose stools Mild. Suspect due to change in dosing of medication as this has been an issue before. Likely an IBS component as well. Exam negative. Fiber supplement and Align probiotic daily. Dietary measures reviewed. Be consistent with medication. Call or follow-up if not resolving.

## 2015-10-26 NOTE — Patient Instructions (Signed)
Please continue medications daily at the same times. Stay hydrated. Start a daily fiber supplement (Metamucil or Benefiber). Also start the daily probiotic Align to help with gas and bloating.  If symptoms are not resolving over the next 1-2 weeks, call me and we will proceed with lab/stool studies  Food Choices to Help Relieve Diarrhea, Adult When you have diarrhea, the foods you eat and your eating habits are very important. Choosing the right foods and drinks can help relieve diarrhea. Also, because diarrhea can last up to 7 days, you need to replace lost fluids and electrolytes (such as sodium, potassium, and chloride) in order to help prevent dehydration.  WHAT GENERAL GUIDELINES DO I NEED TO FOLLOW?  Slowly drink 1 cup (8 oz) of fluid for each episode of diarrhea. If you are getting enough fluid, your urine will be clear or pale yellow.  Eat starchy foods. Some good choices include white rice, white toast, pasta, low-fiber cereal, baked potatoes (without the skin), saltine crackers, and bagels.  Avoid large servings of any cooked vegetables.  Limit fruit to two servings per day. A serving is  cup or 1 small piece.  Choose foods with less than 2 g of fiber per serving.  Limit fats to less than 8 tsp (38 g) per day.  Avoid fried foods.  Eat foods that have probiotics in them. Probiotics can be found in certain dairy products.  Avoid foods and beverages that may increase the speed at which food moves through the stomach and intestines (gastrointestinal tract). Things to avoid include:  High-fiber foods, such as dried fruit, raw fruits and vegetables, nuts, seeds, and whole grain foods.  Spicy foods and high-fat foods.  Foods and beverages sweetened with high-fructose corn syrup, honey, or sugar alcohols such as xylitol, sorbitol, and mannitol. WHAT FOODS ARE RECOMMENDED? Grains White rice. White, [place], or pita breads (fresh or toasted), including plain rolls, buns, or bagels.  White pasta. Saltine, soda, or graham crackers. Pretzels. Low-fiber cereal. Cooked cereals made with water (such as cornmeal, farina, or cream cereals). Plain muffins. Matzo. Melba toast. Zwieback.  Vegetables Potatoes (without the skin). Strained tomato and vegetable juices. Most well-cooked and canned vegetables without seeds. Tender lettuce. Fruits Cooked or canned applesauce, apricots, cherries, fruit cocktail, grapefruit, peaches, pears, or plums. Fresh bananas, apples without skin, cherries, grapes, cantaloupe, grapefruit, peaches, oranges, or plums.  Meat and Other Protein Products Baked or boiled chicken. Eggs. Tofu. Fish. Seafood. Smooth peanut butter. Ground or well-cooked tender beef, ham, veal, lamb, pork, or poultry.  Dairy Plain yogurt, kefir, and unsweetened liquid yogurt. Lactose-free milk, buttermilk, or soy milk. Plain hard cheese. Beverages Sport drinks. Clear broths. Diluted fruit juices (except prune). Regular, caffeine-free sodas such as ginger ale. Water. Decaffeinated teas. Oral rehydration solutions. Sugar-free beverages not sweetened with sugar alcohols. Other Bouillon, broth, or soups made from recommended foods.  The items listed above may not be a complete list of recommended foods or beverages. Contact your dietitian for more options. WHAT FOODS ARE NOT RECOMMENDED? Grains Whole grain, whole wheat, bran, or rye breads, rolls, pastas, crackers, and cereals. Wild or brown rice. Cereals that contain more than 2 g of fiber per serving. Corn tortillas or taco shells. Cooked or dry oatmeal. Granola. Popcorn. Vegetables Raw vegetables. Cabbage, broccoli, Brussels sprouts, artichokes, baked beans, beet greens, corn, kale, legumes, peas, sweet potatoes, and yams. Potato skins. Cooked spinach and cabbage. Fruits Dried fruit, including raisins and dates. Raw fruits. Stewed or dried prunes. Fresh apples with  skin, apricots, mangoes, pears, raspberries, and strawberries.  Meat  and Other Protein Products Chunky peanut butter. Nuts and seeds. Beans and lentils. Tomasa Blase.  Dairy High-fat cheeses. Milk, chocolate milk, and beverages made with milk, such as milk shakes. Cream. Ice cream. Sweets and Desserts Sweet rolls, doughnuts, and sweet breads. Pancakes and waffles. Fats and Oils Butter. Cream sauces. Margarine. Salad oils. Plain salad dressings. Olives. Avocados.  Beverages Caffeinated beverages (such as coffee, tea, soda, or energy drinks). Alcoholic beverages. Fruit juices with pulp. Prune juice. Soft drinks sweetened with high-fructose corn syrup or sugar alcohols. Other Coconut. Hot sauce. Chili powder. Mayonnaise. Gravy. Cream-based or milk-based soups.  The items listed above may not be a complete list of foods and beverages to avoid. Contact your dietitian for more information. WHAT SHOULD I DO IF I BECOME DEHYDRATED? Diarrhea can sometimes lead to dehydration. Signs of dehydration include dark urine and dry mouth and skin. If you think you are dehydrated, you should rehydrate with an oral rehydration solution. These solutions can be purchased at pharmacies, retail stores, or online.  Drink -1 cup (120-240 mL) of oral rehydration solution each time you have an episode of diarrhea. If drinking this amount makes your diarrhea worse, try drinking smaller amounts more often. For example, drink 1-3 tsp (5-15 mL) every 5-10 minutes.  A general rule for staying hydrated is to drink 1-2 L of fluid per day. Talk to your health care provider about the specific amount you should be drinking each day. Drink enough fluids to keep your urine clear or pale yellow.   This information is not intended to replace advice given to you by your health care provider. Make sure you discuss any questions you have with your health care provider.   Document Released: 02/17/2004 Document Revised: 12/18/2014 Document Reviewed: 10/20/2013 Elsevier Interactive Patient Education [employer].

## 2015-10-26 NOTE — Progress Notes (Signed)
Pre visit review using our clinic review tool, if applicable. No additional management support is needed unless otherwise documented below in the visit note/SLS  

## 2015-12-02 ENCOUNTER — Telehealth: Payer: Self-pay | Admitting: Medical

## 2015-12-15 ENCOUNTER — Ambulatory Visit: Payer: BLUE CROSS/BLUE SHIELD | Admitting: Medical

## 2015-12-16 ENCOUNTER — Ambulatory Visit (INDEPENDENT_AMBULATORY_CARE_PROVIDER_SITE_OTHER): Payer: BLUE CROSS/BLUE SHIELD | Admitting: Medical

## 2015-12-16 ENCOUNTER — Ambulatory Visit (HOSPITAL_BASED_OUTPATIENT_CLINIC_OR_DEPARTMENT_OTHER)
Admission: RE | Admit: 2015-12-16 | Discharge: 2015-12-16 | Disposition: A | Discharge status: Home / Self Care | Payer: BLUE CROSS/BLUE SHIELD | Source: Ambulatory Visit | Attending: Medical | Admitting: Medical

## 2015-12-16 ENCOUNTER — Encounter: Payer: Self-pay | Admitting: Medical

## 2015-12-16 VITALS — BP 130/88 | HR 87 | Temp 98.1°F | Ht 71.0 in | Wt 243.0 lb

## 2015-12-16 DIAGNOSIS — E1140 Type 2 diabetes mellitus with diabetic neuropathy, unspecified: Secondary | ICD-10-CM | POA: Insufficient documentation

## 2015-12-16 DIAGNOSIS — R195 Other fecal abnormalities: Secondary | ICD-10-CM

## 2015-12-16 DIAGNOSIS — M25561 Pain in right knee: Secondary | ICD-10-CM

## 2015-12-16 DIAGNOSIS — M545 Low back pain: Secondary | ICD-10-CM

## 2015-12-16 DIAGNOSIS — M25552 Pain in left hip: Secondary | ICD-10-CM

## 2015-12-16 DIAGNOSIS — R1084 Generalized abdominal pain: Secondary | ICD-10-CM | POA: Diagnosis not present

## 2015-12-16 DIAGNOSIS — M25571 Pain in right ankle and joints of right foot: Secondary | ICD-10-CM

## 2015-12-16 DIAGNOSIS — Z1211 Encounter for screening for malignant neoplasm of colon: Secondary | ICD-10-CM

## 2015-12-16 DIAGNOSIS — M7989 Other specified soft tissue disorders: Secondary | ICD-10-CM | POA: Insufficient documentation

## 2015-12-16 MED ORDER — DICYCLOMINE HCL 10 MG PO CAPS: 10.0000 mg | ORAL_CAPSULE | Freq: Three times a day (TID) | ORAL | Status: DC

## 2015-12-16 MED ORDER — TRAMADOL HCL 50 MG PO TABS: 50.0000 mg | ORAL_TABLET | Freq: Two times a day (BID) | ORAL | Status: DC | PRN

## 2015-12-16 NOTE — Progress Notes (Signed)
Subjective:    Patient ID: Jonathan Clayton, male    DOB: [birth year], 50 y.o.   MRN: [id number]  HPI Patient presents to clinic today c/o 1.5 weeks of increased gaseousness and bloating associated with loose stools occuring 3 times per day. Denies fever, chills, N/V, heart burn, abdominal pain, melena, hematochezia or tenesmus. Denies recent travel or sick contact. Endorses he recently changed timing of his Toujeo and typically when that happens he gets some mild stomach upset.  Above hpi from last visit.    Pt in for follow up. He states he gets occasional loose stools. Last few days some watery stools. Often after eating will get loose stools.   Pt did work temporary job during [employer] at [employer]. Pt states one day he vaccumed for about 5 hours. Afterwords rt ankle hurt moderately. The ankle pain has lessened some but not completely.   Pt also reports some feeling of stiff rt knee. He has history of contusion to rt knee in 2014 and every since then in 2014 he had some knee pain.  Also pt has lumbar back pain. Pt went to neurosurgeon. They were going to order MRI but they never called pt. Pt wants to consider referral to other neurosurgeon.  Pt back pain also present since 2014. Occasional radicular pain all the way down his left leg. No saddle anesthesia. No foot drop.    Review of Systems  Constitutional: Negative for fever, chills and fatigue.  Respiratory: Negative for cough, chest tightness, shortness of breath and wheezing.   Cardiovascular: Negative for chest pain and palpitations.  Gastrointestinal: Negative for nausea, vomiting, abdominal pain, diarrhea, constipation, blood in stool and abdominal distention.       Loose stools. Some gas.  Musculoskeletal: Positive for back pain.       Rt knee pain. Rt ankle pain. Lumbar back pain. Lt hip pain.  Skin: Negative for pallor and rash.  Hematological: Negative for adenopathy.  Psychiatric/Behavioral: Negative for behavioral  problems and confusion.   Past Medical History  Diagnosis Date  . STEMI of inferolateral wall- 08/07/14 08/07/2014  . CAD S/P PCI mLAD & Cx-OM (Promus DES) 08/07/2014    mLAD & D2 100% - PCI to LAD w/ Promus P DES 2.25 x 20 & 8 (2.4), D2 Med Rx; Cx-OM tandem 80% -> PCI Promus P DES 2.75 x 16 & 3.0 x 24 (3 mm distal & 3.25 mm prox)  . Cardiomyopathy, ischemic-EF 40% by echo - Resolved  09/01/2014    Echo: 8/30: Moderate LVH, EF roughly 40%. Pseudo-normal Gr2 DD, apical akinesis, moderate HK of apical inferior mid inferolateral and anterolateral as well as apical septal and apical lateral walls with mild HK of apical anterior   . Chronic diastolic heart failure (HCC)     Echo 02/2015: Mild Conc LVH - Gr 1 DD with high filling pressures, EF 55-60% dyskinesis of the apical myocardium  . Essential hypertension, benign   . Dyslipidemia with high LDL and low HDL   . 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 GERD (gastroesophageal reflux disease)   . Osteoarthritis of back     With chronic low back pain  . Hx of completed stroke February 17 2010    Social History   Social History  . Marital Status: Married    Spouse Name: N/A  . Number of Children: N/A  . Years of Education: N/A  Occupational History  . Not on file.   Social History Clayton Topics  . Smoking status: Never Smoker   . Smokeless tobacco: Not on file  . Alcohol Use: No  . Drug Use: No  . Sexual Activity: Yes   Other Topics Concern  . Not on file   Social History Narrative   Married. Does not smoke -- never smoked.   He is currently doing cardiac rehabilitation    Past Surgical History  Procedure Laterality Date  . Percutaneous coronary stent intervention (pci-s)  08/07/2014     PCI-mCx-OM2 tandem 80% - Promus P DES 2.75 mm x 16 mm and 3.0 mm x 24 mm (3 mm distal & 3.25 mm prox); Mid LAD 100% - 2 Overlapping Promus P DES 2.25 mm x 20 mm & 2.25  mm x 8 mm (2.4 mm) only TIMI2 apical flow, Occluded D2 (not intervened on)  . Left heart catheterization with coronary angiogram N/A 08/07/2014    Procedure: LEFT HEART CATHETERIZATION WITH CORONARY ANGIOGRAM;  Surgeon: Marykay Lex, MD;  Location: [facility] CATH LAB;  Service: Cardiovascular;  Laterality: N/A;  . Transthoracic echocardiogram  07/13/2014; 02/2015    Moderate LVH, EF roughly 40%. Pseudo-normal Gr2 DD, apical akinesis, moderate HK of apical inferior mid inferolateral and anterolateral as well as apical septal and apical lateral walls with mild HK of apical anterior;; f/u: EF 55-60%, Mild LVH, Apical dyskinesis, Gr 1 DD with High Filling Pressures.    Family History  Problem Relation Age of Onset  . Cancer Father   . Diabetes Neg Hx     No Known Allergies  Current Outpatient Prescriptions on File Prior to Visit  Medication Sig Dispense Refill  . acetaminophen (TYLENOL) 500 MG tablet Take 1,000 mg by mouth every 6 (six) hours as needed for moderate pain.    Marland Kitchen aspirin 81 MG chewable tablet Chew 1 tablet (81 mg total) by mouth daily.    Marland Kitchen atorvastatin (LIPITOR) 40 MG tablet Take 1 tablet (40 mg total) by mouth daily at 6 PM. 30 tablet 5  . carvedilol (COREG) 25 MG tablet Take 1 tablet (25 mg total) by mouth 2 (two) times daily with a meal. 60 tablet 3  . clopidogrel (PLAVIX) 75 MG tablet Take 1 tablet (75 mg total) by mouth daily. 30 tablet 11  . furosemide (LASIX) 20 MG tablet Take 1 tablet (20 mg total) by mouth daily. 30 tablet 11  . gabapentin (NEURONTIN) 600 MG tablet Take 1 tablet (600 mg total) by mouth 2 (two) times daily. 60 tablet 3  . glucose blood (RELION GLUCOSE TEST STRIPS) test strip 1 each by Other route 2 (two) times daily. And lancets 2/day 250.01 100 each 12  . Insulin Glargine (TOUJEO SOLOSTAR) 300 UNIT/ML SOPN Inject 120 Units into the skin every morning. And pen needles 1/day 9 pen 11  . lisinopril (PRINIVIL,ZESTRIL) 20 MG tablet Take 1 tablet (20 mg total) by  mouth daily. 30 tablet 3  . nitroGLYCERIN (NITROSTAT) 0.4 MG SL tablet Place 1 tablet (0.4 mg total) under the tongue every 5 (five) minutes as needed for chest pain. 25 tablet 2  . nystatin cream (MYCOSTATIN) Apply 1 application topically 2 (two) times daily. 30 g 0  . ranitidine (ZANTAC) 75 MG tablet Take 75 mg by mouth 2 (two) times daily as needed for heartburn.     . traMADol (ULTRAM) 50 MG tablet Take 1 tablet (50 mg total) by mouth every 6 (six) hours as needed. 16 tablet  0   No current facility-administered medications on file prior to visit.    BP 130/88 mmHg  Pulse 87  Temp(Src) 98.1 F (36.7 C) (Oral)  Ht  (1.803 m)  Wt 243 lb (110.224 kg)  BMI 33.91 kg/m2  SpO2 97%       Objective:   Physical Exam  General- No acute distress. Pleasant patient. Neck- Full range of motion, no jvd Lungs- Clear, even and unlabored. Heart- regular rate and rhythm. Neurologic- CNII- XII grossly intact.   Abdomen Inspection:-Inspection Normal.  Palpation/Perucssion: Palpation and Percussion of the abdomen reveal- Non Tender, No Rebound tenderness, No rigidity(Guarding) and No Palpable abdominal masses.  Liver:-Normal.  Spleen:- Normal.    Back Mid lumbar spine tenderness to palpation. Pain on straight leg lift. Pain on lateral movements and flexion/extension of the spine.  Lower ext neurologic  L5-S1 sensation intact bilaterally. Normal patellar reflexes bilaterally. No foot drop bilaterally.  Lt hip- pain on rom. No crepitus. Rt ankle- mild pain on palpation medial aspect Rt knee- good rom. No instability.but tibial plateua tenderness. No crepitus.      Assessment & Plan:  For abdomen discomfort get labs.  For loose stools medication for possible ibs but also do stool panel studies.  Refer for colonoscopy.  For back pain will try to contact neurosugeon who said they would order MRI.  For various areas of pain can use low dose ibuprofen. But will also rx  tramadol.  Follow up in 14 days or as needed  Xray of rt ankle and rt knee.

## 2015-12-16 NOTE — Telephone Encounter (Signed)
Pt saw neurosurgeon who said they were going to order lumbar mri. But then they never contacted. Him. He is upset about this. So at this point just want the mri.

## 2015-12-16 NOTE — Patient Instructions (Addendum)
For abdomen discomfort get labs.  For loose stools medication for possible ibs but also do stool panel studies.  Refer for colonoscopy.  For back pain will try to contact neurosugeon who said they would order MRI.  For various areas of pain can use low dose ibuprofen. But will also rx tramadol.  Follow up in 14 days or as needed  Xray of rt ankle and rt knee.

## 2015-12-16 NOTE — Progress Notes (Signed)
Pre visit review using our clinic review tool, if applicable. No additional management support is needed unless otherwise documented below in the visit note. 

## 2015-12-17 LAB — CBC WITH DIFFERENTIAL/PLATELET
BASOS ABS: 0.1 10*3/uL (ref 0.0–0.1)
Basophils Relative: 0.6 % (ref 0.0–3.0)
EOS ABS: 0.2 10*3/uL (ref 0.0–0.7)
Eosinophils Relative: 2.3 % (ref 0.0–5.0)
HEMATOCRIT: 41.3 % (ref 39.0–52.0)
Hemoglobin: 13.3 g/dL (ref 13.0–17.0)
LYMPHS PCT: 25.1 % (ref 12.0–46.0)
Lymphs Abs: 2.2 10*3/uL (ref 0.7–4.0)
MCHC: 32.3 g/dL (ref 30.0–36.0)
MCV: 89.7 fl (ref 78.0–100.0)
MONO ABS: 0.8 10*3/uL (ref 0.1–1.0)
Monocytes Relative: 9.1 % (ref 3.0–12.0)
NEUTROS ABS: 5.5 10*3/uL (ref 1.4–7.7)
Neutrophils Relative %: 62.9 % (ref 43.0–77.0)
Platelets: 347 10*3/uL (ref 150.0–400.0)
RBC: 4.61 Mil/uL (ref 4.22–5.81)
RDW: 14.6 % (ref 11.5–15.5)
WBC: 8.7 10*3/uL (ref 4.0–10.5)

## 2015-12-17 LAB — LIPASE: Lipase: 20 U/L (ref 11.0–59.0)

## 2015-12-17 LAB — COMPREHENSIVE METABOLIC PANEL
ALK PHOS: 92 U/L (ref 39–117)
ALT: 16 U/L (ref 0–53)
AST: 16 U/L (ref 0–37)
Albumin: 3.9 g/dL (ref 3.5–5.2)
BUN: 27 mg/dL — AB (ref 6–23)
CO2: 28 meq/L (ref 19–32)
Calcium: 9.8 mg/dL (ref 8.4–10.5)
Chloride: 105 mEq/L (ref 96–112)
Creatinine, Ser: 1.54 mg/dL — ABNORMAL HIGH (ref 0.40–1.50)
GFR: 50.96 mL/min — ABNORMAL LOW (ref >60.00)
GLUCOSE: 213 mg/dL — AB (ref 70–99)
Potassium: 3.9 mEq/L (ref 3.5–5.1)
Sodium: 140 mEq/L (ref 135–145)
TOTAL PROTEIN: 7.2 g/dL (ref 6.0–8.3)
Total Bilirubin: 0.4 mg/dL (ref 0.2–1.2)

## 2015-12-17 LAB — H. PYLORI BREATH TEST: H. PYLORI BREATH TEST: NOT DETECTED

## 2015-12-17 LAB — AMYLASE: Amylase: 30 U/L (ref 27–131)

## 2015-12-21 LAB — OVA AND PARASITE EXAMINATION: OP: NONE SEEN

## 2015-12-21 LAB — CLOSTRIDIUM DIFFICILE BY PCR: CDIFFPCR: NOT DETECTED

## 2015-12-23 ENCOUNTER — Telehealth: Payer: Self-pay

## 2015-12-23 DIAGNOSIS — K589 Irritable bowel syndrome without diarrhea: Secondary | ICD-10-CM

## 2015-12-23 NOTE — Telephone Encounter (Signed)
Ramon Dredge wanted me to follow up with you on the referral for neurosurgery for this pt. The patient would like to know the status of the referral. Please advise.

## 2015-12-24 ENCOUNTER — Encounter: Payer: Self-pay | Admitting: Internal Medicine

## 2015-12-24 LAB — STOOL CULTURE

## 2015-12-24 NOTE — Telephone Encounter (Signed)
Referral has been resent to [facility]/awaiting appt

## 2015-12-24 NOTE — Telephone Encounter (Signed)
Also I referred him to neurosurgeon office who told him that would get mri of his spine. Then that office never contacted him. Pt mentioned referring to other neurosurgeon. But I think since he is established and was about to get mri best to contact their office and try to get them to reactivate that order?

## 2015-12-28 ENCOUNTER — Ambulatory Visit: Payer: BLUE CROSS/BLUE SHIELD | Admitting: Endocrinology

## 2015-12-28 ENCOUNTER — Ambulatory Visit: Payer: BLUE CROSS/BLUE SHIELD | Admitting: Cardiology

## 2015-12-30 ENCOUNTER — Encounter: Payer: Self-pay | Admitting: Endocrinology

## 2015-12-30 ENCOUNTER — Ambulatory Visit (INDEPENDENT_AMBULATORY_CARE_PROVIDER_SITE_OTHER): Payer: BLUE CROSS/BLUE SHIELD | Admitting: Endocrinology

## 2015-12-30 VITALS — BP 136/88 | HR 77 | Temp 97.8°F | Resp 16 | Wt 241.6 lb

## 2015-12-30 DIAGNOSIS — E1159 Type 2 diabetes mellitus with other circulatory complications: Secondary | ICD-10-CM

## 2015-12-30 LAB — POCT GLYCOSYLATED HEMOGLOBIN (HGB A1C): Hemoglobin A1C: 11.8

## 2015-12-30 MED ORDER — INSULIN ISOPHANE HUMAN 100 UNIT/ML KWIKPEN: 120.0000 [IU] | PEN_INJECTOR | SUBCUTANEOUS | Status: DC

## 2015-12-30 NOTE — Progress Notes (Signed)
Subjective:    Patient ID: Jonathan Clayton, male    DOB: [birth year], 50 y.o.   MRN: [id number]  HPI Pt returns for f/u of diabetes mellitus: DM type: Insulin-requiring type 2 Dx'ed: 2000 Complications: painful neuropathy of the lower extremities, CAD, CVA, and renal insufficiency.  Therapy: insulin since 2010. DKA: never Severe hypoglycemia: never. Pancreatitis: never.  Other: due to h/o noncompliance, he is on qd insulin.  Interval history: Pt says he takes the insulin as rx'ed.  no cbg record, but states cbg's are 100 in am.  It is in general higher as the day goes on.   Past Medical History  Diagnosis Date  . STEMI of inferolateral wall- 08/07/14 08/07/2014  . CAD S/P PCI mLAD & Cx-OM (Promus DES) 08/07/2014    mLAD & D2 100% - PCI to LAD w/ Promus P DES 2.25 x 20 & 8 (2.4), D2 Med Rx; Cx-OM tandem 80% -> PCI Promus P DES 2.75 x 16 & 3.0 x 24 (3 mm distal & 3.25 mm prox)  . Cardiomyopathy, ischemic-EF 40% by echo - Resolved  09/01/2014    Echo: 8/30: Moderate LVH, EF roughly 40%. Pseudo-normal Gr2 DD, apical akinesis, moderate HK of apical inferior mid inferolateral and anterolateral as well as apical septal and apical lateral walls with mild HK of apical anterior   . Chronic diastolic heart failure (HCC)     Echo 02/2015: Mild Conc LVH - Gr 1 DD with high filling pressures, EF 55-60% dyskinesis of the apical myocardium  . Essential hypertension, benign   . Dyslipidemia with high LDL and low HDL   . 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 GERD (gastroesophageal reflux disease)   . Osteoarthritis of back     With chronic low back pain  . Hx of completed stroke February 17 2010    Past Surgical History  Procedure Laterality Date  . Percutaneous coronary stent intervention (pci-s)  08/07/2014     PCI-mCx-OM2 tandem 80% - Promus P DES 2.75 mm x 16 mm and 3.0 mm x 24 mm (3 mm distal & 3.25 mm prox); Mid LAD  100% - 2 Overlapping Promus P DES 2.25 mm x 20 mm & 2.25 mm x 8 mm (2.4 mm) only TIMI2 apical flow, Occluded D2 (not intervened on)  . Left heart catheterization with coronary angiogram N/A 08/07/2014    Procedure: LEFT HEART CATHETERIZATION WITH CORONARY ANGIOGRAM;  Surgeon: Marykay Lex, MD;  Location: [facility] CATH LAB;  Service: Cardiovascular;  Laterality: N/A;  . Transthoracic echocardiogram  07/13/2014; 02/2015    Moderate LVH, EF roughly 40%. Pseudo-normal Gr2 DD, apical akinesis, moderate HK of apical inferior mid inferolateral and anterolateral as well as apical septal and apical lateral walls with mild HK of apical anterior;; f/u: EF 55-60%, Mild LVH, Apical dyskinesis, Gr 1 DD with High Filling Pressures.    Social History   Social History  . Marital Status: Married    Spouse Name: N/A  . Number of Children: N/A  . Years of Education: N/A   Occupational History  . Not on file.   Social History Main Topics  . Smoking status: Never Smoker   . Smokeless tobacco: Not on file  . Alcohol Use: No  . Drug Use: No  . Sexual Activity: Yes   Other Topics Concern  . Not on file   Social History Narrative   Married. Does not  smoke -- never smoked.   He is currently doing cardiac rehabilitation    Current Outpatient Prescriptions on File Prior to Visit  Medication Sig Dispense Refill  . acetaminophen (TYLENOL) 500 MG tablet Take 1,000 mg by mouth every 6 (six) hours as needed for moderate pain.    Marland Kitchen aspirin 81 MG chewable tablet Chew 1 tablet (81 mg total) by mouth daily.    Marland Kitchen atorvastatin (LIPITOR) 40 MG tablet Take 1 tablet (40 mg total) by mouth daily at 6 PM. 30 tablet 5  . carvedilol (COREG) 25 MG tablet Take 1 tablet (25 mg total) by mouth 2 (two) times daily with a meal. 60 tablet 3  . clopidogrel (PLAVIX) 75 MG tablet Take 1 tablet (75 mg total) by mouth daily. 30 tablet 11  . dicyclomine (BENTYL) 10 MG capsule Take 1 capsule (10 mg total) by mouth 3 (three) times daily  before meals. 90 capsule 0  . furosemide (LASIX) 20 MG tablet Take 1 tablet (20 mg total) by mouth daily. 30 tablet 11  . gabapentin (NEURONTIN) 600 MG tablet Take 1 tablet (600 mg total) by mouth 2 (two) times daily. 60 tablet 3  . glucose blood (RELION GLUCOSE TEST STRIPS) test strip 1 each by Other route 2 (two) times daily. And lancets 2/day 250.01 100 each 12  . lisinopril (PRINIVIL,ZESTRIL) 20 MG tablet Take 1 tablet (20 mg total) by mouth daily. 30 tablet 3  . nitroGLYCERIN (NITROSTAT) 0.4 MG SL tablet Place 1 tablet (0.4 mg total) under the tongue every 5 (five) minutes as needed for chest pain. 25 tablet 2  . nystatin cream (MYCOSTATIN) Apply 1 application topically 2 (two) times daily. 30 g 0  . ranitidine (ZANTAC) 75 MG tablet Take 75 mg by mouth 2 (two) times daily as needed for heartburn.     . traMADol (ULTRAM) 50 MG tablet Take 1 tablet (50 mg total) by mouth every 12 (twelve) hours as needed. 30 tablet 0   No current facility-administered medications on file prior to visit.    No Known Allergies  Family History  Problem Relation Age of Onset  . Cancer Father   . Diabetes Neg Hx     BP 136/88 mmHg  Pulse 77  Temp(Src) 97.8 F (36.6 C) (Oral)  Resp 16  Wt 241 lb 9.6 oz (109.589 kg)  SpO2 96%  Review of Systems He denies hypoglycemia    Objective:   Physical Exam VITAL SIGNS:  See vs page.  GENERAL: no distress.  Pulses: dorsalis pedis intact bilat.   MSK: no deformity of the feet. CV: 1+ bilat leg edema.  Skin:  no ulcer on the feet.  normal color and temp on the feet. Neuro: sensation is intact to touch on the feet.    A1c=11.8%    Assessment & Plan:  DM: The pattern of his cbg's indicates he needs a faster-acting qd insulin.    Patient is advised the following: Patient Instructions  check your blood sugar twice a day.  vary the time of day when you check, between before the 3 meals, and at bedtime.  also check if you have symptoms of your blood sugar  being too high or too low.  please keep a record of the readings and bring it to your next appointment here.  You can write it on any piece of paper.  please call us sooner if your blood sugar goes below 70, or if you have a lot of readings over 200.  Please change your current insulin to "NPH," at the same amount.   On this type of insulin schedule, you should eat meals on a regular schedule.  If a meal is missed or significantly delayed, your blood sugar could go low.  Please come back for a follow-up appointment in 2 months.

## 2015-12-30 NOTE — Patient Instructions (Addendum)
check your blood sugar twice a day.  vary the time of day when you check, between before the 3 meals, and at bedtime.  also check if you have symptoms of your blood sugar being too high or too low.  please keep a record of the readings and bring it to your next appointment here.  You can write it on any piece of paper.  please call us sooner if your blood sugar goes below 70, or if you have a lot of readings over 200.     Please change your current insulin to "NPH," at the same amount.   On this type of insulin schedule, you should eat meals on a regular schedule.  If a meal is missed or significantly delayed, your blood sugar could go low.  Please come back for a follow-up appointment in 2 months.

## 2016-01-06 ENCOUNTER — Encounter: Payer: Self-pay | Admitting: Medical

## 2016-01-06 ENCOUNTER — Ambulatory Visit (INDEPENDENT_AMBULATORY_CARE_PROVIDER_SITE_OTHER): Payer: BLUE CROSS/BLUE SHIELD | Admitting: Medical

## 2016-01-06 VITALS — BP 114/78 | HR 80 | Temp 98.1°F | Ht 71.0 in | Wt 242.0 lb

## 2016-01-06 DIAGNOSIS — M5410 Radiculopathy, site unspecified: Secondary | ICD-10-CM

## 2016-01-06 DIAGNOSIS — M5442 Lumbago with sciatica, left side: Secondary | ICD-10-CM | POA: Diagnosis not present

## 2016-01-06 DIAGNOSIS — Z23 Encounter for immunization: Secondary | ICD-10-CM

## 2016-01-06 DIAGNOSIS — K589 Irritable bowel syndrome without diarrhea: Secondary | ICD-10-CM

## 2016-01-06 MED ORDER — TRAMADOL HCL 50 MG PO TABS: ORAL_TABLET | ORAL | Status: AC

## 2016-01-06 MED ORDER — DICYCLOMINE HCL 10 MG PO CAPS: 10.0000 mg | ORAL_CAPSULE | Freq: Three times a day (TID) | ORAL | Status: DC

## 2016-01-06 NOTE — Progress Notes (Signed)
Pre visit review using our clinic review tool, if applicable. No additional management support is needed unless otherwise documented below in the visit note. 

## 2016-01-06 NOTE — Progress Notes (Signed)
Subjective:    Patient ID: Jonathan Clayton, male    DOB: 1965-12-27, 50 y.o.   MRN: [id number]  HPI  Pt in for follow up.   Pt states his stomach feels better. Pt is on bentyl. Pt states some soft stools and occasional diarrhea but no as bad as it was before. No cramping. He is satisfied with improvement.  Pt is having a colonoscopy February 21, 2016.  Pt has back pain. He has appointment with neurosurgeon. He does not want to go due to copay. Pt offered appointment tomorrow but patient declined. Pt states wants professional honest opinion.  Back pain has been going on for year. Pain more on left side. Pain for 1.5-2 years. Some left lower radiating pain 3-4 days a week at times. No saddle anesthesia. No leg weakness of foot drop.  Pt never used the tramadol that I wrote him.(He never filled the rx and lost the script in his house). Was for 30 tabs.  Pt will get fluvaccine today.     Review of Systems  Constitutional: Negative for chills and fatigue.  Respiratory: Negative for cough, chest tightness, shortness of breath and wheezing.   Cardiovascular: Negative for chest pain and palpitations.  Gastrointestinal: Negative for abdominal pain, diarrhea, constipation, blood in stool and rectal pain.       It is better. See hpi.  Musculoskeletal: Positive for back pain.  Neurological: Negative for dizziness and facial asymmetry.  Hematological: Negative for adenopathy. Does not bruise/bleed easily.  Psychiatric/Behavioral: Negative for behavioral problems and confusion.    Past Medical History  Diagnosis Date  . STEMI of inferolateral wall- 08/07/14 08/07/2014  . CAD S/P PCI mLAD & Cx-OM (Promus DES) 08/07/2014    mLAD & D2 100% - PCI to LAD w/ Promus P DES 2.25 x 20 & 8 (2.4), D2 Med Rx; Cx-OM tandem 80% -> PCI Promus P DES 2.75 x 16 & 3.0 x 24 (3 mm distal & 3.25 mm prox)  . Cardiomyopathy, ischemic-EF 40% by echo - Resolved  09/01/2014    Echo: 8/30: Moderate LVH, EF roughly 40%.  Pseudo-normal Gr2 DD, apical akinesis, moderate HK of apical inferior mid inferolateral and anterolateral as well as apical septal and apical lateral walls with mild HK of apical anterior   . Chronic diastolic heart failure (HCC)     Echo 02/2015: Mild Conc LVH - Gr 1 DD with high filling pressures, EF 55-60% dyskinesis of the apical myocardium  . Essential hypertension, benign   . Dyslipidemia with high LDL and low HDL   . 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 GERD (gastroesophageal reflux disease)   . Osteoarthritis of back     With chronic low back pain  . Hx of completed stroke February 17 2010    Social History   Social History  . Marital Status: Married    Spouse Name: N/A  . Number of Children: N/A  . Years of Education: N/A   Occupational History  . Not on file.   Social History Main Topics  . Smoking status: Never Smoker   . Smokeless tobacco: Not on file  . Alcohol Use: No  . Drug Use: No  . Sexual Activity: Yes   Other Topics Concern  . Not on file   Social History Narrative   Married. Does not smoke -- never smoked.   He is currently doing cardiac rehabilitation  Past Surgical History  Procedure Laterality Date  . Percutaneous coronary stent intervention (pci-s)  08/07/2014     PCI-mCx-OM2 tandem 80% - Promus P DES 2.75 mm x 16 mm and 3.0 mm x 24 mm (3 mm distal & 3.25 mm prox); Mid LAD 100% - 2 Overlapping Promus P DES 2.25 mm x 20 mm & 2.25 mm x 8 mm (2.4 mm) only TIMI2 apical flow, Occluded D2 (not intervened on)  . Left heart catheterization with coronary angiogram N/A 08/07/2014    Procedure: LEFT HEART CATHETERIZATION WITH CORONARY ANGIOGRAM;  Surgeon: Marykay Lex, MD;  Location: [facility] CATH LAB;  Service: Cardiovascular;  Laterality: N/A;  . Transthoracic echocardiogram  07/13/2014; 02/2015    Moderate LVH, EF roughly 40%. Pseudo-normal Gr2 DD, apical akinesis, moderate HK  of apical inferior mid inferolateral and anterolateral as well as apical septal and apical lateral walls with mild HK of apical anterior;; f/u: EF 55-60%, Mild LVH, Apical dyskinesis, Gr 1 DD with High Filling Pressures.    Family History  Problem Relation Age of Onset  . Cancer Father   . Diabetes Neg Hx     No Known Allergies  Current Outpatient Prescriptions on File Prior to Visit  Medication Sig Dispense Refill  . acetaminophen (TYLENOL) 500 MG tablet Take 1,000 mg by mouth every 6 (six) hours as needed for moderate pain.    Marland Kitchen aspirin 81 MG chewable tablet Chew 1 tablet (81 mg total) by mouth daily.    Marland Kitchen atorvastatin (LIPITOR) 40 MG tablet Take 1 tablet (40 mg total) by mouth daily at 6 PM. 30 tablet 5  . carvedilol (COREG) 25 MG tablet Take 1 tablet (25 mg total) by mouth 2 (two) times daily with a meal. 60 tablet 3  . clopidogrel (PLAVIX) 75 MG tablet Take 1 tablet (75 mg total) by mouth daily. 30 tablet 11  . dicyclomine (BENTYL) 10 MG capsule Take 1 capsule (10 mg total) by mouth 3 (three) times daily before meals. 90 capsule 0  . furosemide (LASIX) 20 MG tablet Take 1 tablet (20 mg total) by mouth daily. 30 tablet 11  . gabapentin (NEURONTIN) 600 MG tablet Take 1 tablet (600 mg total) by mouth 2 (two) times daily. 60 tablet 3  . glucose blood (RELION GLUCOSE TEST STRIPS) test strip 1 each by Other route 2 (two) times daily. And lancets 2/day 250.01 100 each 12  . Insulin NPH, Human,, Isophane, (HUMULIN N KWIKPEN) 100 UNIT/ML Kiwkpen Inject 120 Units into the skin every morning. And pen needles 2/day 45 mL 11  . lisinopril (PRINIVIL,ZESTRIL) 20 MG tablet Take 1 tablet (20 mg total) by mouth daily. 30 tablet 3  . nitroGLYCERIN (NITROSTAT) 0.4 MG SL tablet Place 1 tablet (0.4 mg total) under the tongue every 5 (five) minutes as needed for chest pain. 25 tablet 2  . nystatin cream (MYCOSTATIN) Apply 1 application topically 2 (two) times daily. 30 g 0  . ranitidine (ZANTAC) 75 MG tablet  Take 75 mg by mouth 2 (two) times daily as needed for heartburn.     . traMADol (ULTRAM) 50 MG tablet Take 1 tablet (50 mg total) by mouth every 12 (twelve) hours as needed. 30 tablet 0   No current facility-administered medications on file prior to visit.    BP 114/78 mmHg  Pulse 80  Temp(Src) 98.1 F (36.7 C) (Oral)  Ht 5\' 11"  (1.803 m)  Wt 242 lb (109.77 kg)  BMI 33.77 kg/m2  SpO2 98%  Objective:   Physical Exam  General Appearance- Not in acute distress.    Chest and Lung Exam Auscultation: Breath sounds:-Normal. Clear even and unlabored. Adventitious sounds:- No Adventitious sounds.  Cardiovascular Auscultation:Rythm - Regular, rate and rythm. Heart Sounds -Normal heart sounds.  Abdomen Inspection:-Inspection Normal.  Palpation/Perucssion: Palpation and Percussion of the abdomen reveal- Non Tender, No Rebound tenderness, No rigidity(Guarding) and No Palpable abdominal masses.  Liver:-Normal.  Spleen:- Normal.   Back Mid lumbar spine tenderness to palpation and lt si area. Pain on straight leg lift. Pain on lateral movements and flexion/extension of the spine.  Lower ext neurologic  L5-S1 sensation intact bilaterally. Normal patellar reflexes bilaterally. No foot drop bilaterally.      Assessment & Plan:  For your ibs continue the bentyl.  For your back pain with some radicular pain, I will try to to order MRI. If order denied then would recommend seeing the neurosurgeon. Will refill the tramadol.  Follow up date 3 months maybe sooner depending on if mri request denied.

## 2016-01-06 NOTE — Patient Instructions (Addendum)
For your ibs continue the bentyl.  For your back pain with some radicular pain, I will try to to order MRI. If order denied then would recommend seeing the neurosurgeon. Will refill the tramadol.  Follow up date 3 months maybe sooner depending on if  mri request denied.

## 2016-01-08 ENCOUNTER — Ambulatory Visit (HOSPITAL_BASED_OUTPATIENT_CLINIC_OR_DEPARTMENT_OTHER)
Admission: RE | Admit: 2016-01-08 | Discharge: 2016-01-08 | Disposition: A | Discharge status: Home / Self Care | Payer: BLUE CROSS/BLUE SHIELD | Source: Ambulatory Visit | Attending: Medical | Admitting: Medical

## 2016-01-08 DIAGNOSIS — M5410 Radiculopathy, site unspecified: Secondary | ICD-10-CM | POA: Insufficient documentation

## 2016-01-08 DIAGNOSIS — M4696 Unspecified inflammatory spondylopathy, lumbar region: Secondary | ICD-10-CM | POA: Diagnosis not present

## 2016-01-08 DIAGNOSIS — M5127 Other intervertebral disc displacement, lumbosacral region: Secondary | ICD-10-CM | POA: Diagnosis not present

## 2016-01-08 DIAGNOSIS — M5136 Other intervertebral disc degeneration, lumbar region: Secondary | ICD-10-CM | POA: Diagnosis not present

## 2016-01-08 DIAGNOSIS — M4806 Spinal stenosis, lumbar region: Secondary | ICD-10-CM | POA: Diagnosis not present

## 2016-01-08 DIAGNOSIS — M5442 Lumbago with sciatica, left side: Secondary | ICD-10-CM | POA: Diagnosis not present

## 2016-01-09 ENCOUNTER — Telehealth: Payer: Self-pay | Admitting: Medical

## 2016-01-09 DIAGNOSIS — M5442 Lumbago with sciatica, left side: Secondary | ICD-10-CM

## 2016-01-09 DIAGNOSIS — M48061 Spinal stenosis, lumbar region without neurogenic claudication: Secondary | ICD-10-CM

## 2016-01-09 NOTE — Telephone Encounter (Signed)
I put in referral to neurosurgeon after I ordered mri. Pt has seen neurosurgeon in the past but he was not happy with one he saw in the past. He told me he wanted to see different neurosurgeon.

## 2016-01-11 NOTE — Telephone Encounter (Signed)
Ibs symptoms can wax and wane/worsen and get better. Day to day variation. If recently worse symptoms could try 2 tab of bentyl 3 times a day. If he were to run out sooner then let us know. He mentioned he is concerned any other symptoms along with loose stools  such as fever chills or increase abdomen pain?

## 2016-01-11 NOTE — Telephone Encounter (Signed)
Pt states that he is still having some diarrhea 2-4 times in the past few days. He wanted PCP to be aware and that the Bentyl is helping but he is concerned.

## 2016-01-12 NOTE — Telephone Encounter (Signed)
Spoke with pt and he voices understanding. Pt is willing to try the change in medication and call the office back in a few days to see if the change in dosage helps his symptoms.

## 2016-01-18 ENCOUNTER — Telehealth: Payer: Self-pay | Admitting: Endocrinology

## 2016-01-18 NOTE — Telephone Encounter (Signed)
See note below to be advised. 

## 2016-01-18 NOTE — Telephone Encounter (Signed)
NPH is too expensive so he is letting you know he is still taking the toujeo

## 2016-01-18 NOTE — Telephone Encounter (Signed)
Ok, thank you

## 2016-02-02 ENCOUNTER — Telehealth: Payer: Self-pay | Admitting: *Deleted

## 2016-02-02 NOTE — Telephone Encounter (Signed)
Spoke with pt. He states he does take Plavix (clopidogrel). Explained to him that we need to make him an appointment with P.A. to go over his history. He got upset and wants to cancel this colonoscopy at this time. Pt states he is going to see his heart doctor in March and he will talk with them about colonoscopy. I explained to pt he needs to see our PA before procedure to see if we can hold plavix. PV and colonoscopy cancelled at this time. Pt aware.

## 2016-02-04 ENCOUNTER — Telehealth: Payer: Self-pay | Admitting: Medical

## 2016-02-04 NOTE — Telephone Encounter (Signed)
Caller name: Self  Can be reached: [phone] Pharmacy:  [id number] - [place], [place] - [address] [phone] ([phone] (Fax)         Reason for call: Patient states that the medication that he is taking dicyclomine (BENTYL) 10 MG capsule is working better with him taking 2 tablets 3 times per day. He just picked up a rx but will need the dosage changed for the next time.

## 2016-02-04 NOTE — Telephone Encounter (Signed)
Changed dosage in chart on 02/04/16.

## 2016-02-10 ENCOUNTER — Encounter: Payer: Self-pay | Admitting: Cardiology

## 2016-02-10 ENCOUNTER — Ambulatory Visit (INDEPENDENT_AMBULATORY_CARE_PROVIDER_SITE_OTHER): Payer: BLUE CROSS/BLUE SHIELD | Admitting: Cardiology

## 2016-02-10 VITALS — BP 122/84 | HR 74 | Ht 71.0 in | Wt 248.0 lb

## 2016-02-10 DIAGNOSIS — I5032 Chronic diastolic (congestive) heart failure: Secondary | ICD-10-CM | POA: Diagnosis not present

## 2016-02-10 DIAGNOSIS — I2510 Atherosclerotic heart disease of native coronary artery without angina pectoris: Secondary | ICD-10-CM

## 2016-02-10 DIAGNOSIS — I255 Ischemic cardiomyopathy: Secondary | ICD-10-CM

## 2016-02-10 DIAGNOSIS — Z79899 Other long term (current) drug therapy: Secondary | ICD-10-CM | POA: Diagnosis not present

## 2016-02-10 DIAGNOSIS — Z9861 Coronary angioplasty status: Secondary | ICD-10-CM

## 2016-02-10 DIAGNOSIS — E669 Obesity, unspecified: Secondary | ICD-10-CM

## 2016-02-10 DIAGNOSIS — E784 Other hyperlipidemia: Secondary | ICD-10-CM | POA: Diagnosis not present

## 2016-02-10 DIAGNOSIS — I10 Essential (primary) hypertension: Secondary | ICD-10-CM

## 2016-02-10 DIAGNOSIS — I2119 ST elevation (STEMI) myocardial infarction involving other coronary artery of inferior wall: Secondary | ICD-10-CM | POA: Diagnosis not present

## 2016-02-10 DIAGNOSIS — E785 Hyperlipidemia, unspecified: Secondary | ICD-10-CM

## 2016-02-10 MED ORDER — CARVEDILOL 25 MG PO TABS: 25.0000 mg | ORAL_TABLET | Freq: Two times a day (BID) | ORAL | Status: AC

## 2016-02-10 MED ORDER — ATORVASTATIN CALCIUM 40 MG PO TABS: 40.0000 mg | ORAL_TABLET | Freq: Every day | ORAL | Status: DC

## 2016-02-10 MED ORDER — LISINOPRIL 20 MG PO TABS: 20.0000 mg | ORAL_TABLET | Freq: Every day | ORAL | Status: AC

## 2016-02-10 MED ORDER — CLOPIDOGREL BISULFATE 75 MG PO TABS: 75.0000 mg | ORAL_TABLET | Freq: Every day | ORAL | Status: AC

## 2016-02-10 MED ORDER — FUROSEMIDE 40 MG PO TABS: ORAL_TABLET | ORAL | Status: AC

## 2016-02-10 NOTE — Progress Notes (Signed)
PCP: Esperanza Richters, PA-C  Clinic Note: Chief Complaint  Patient presents with  . Follow-up    6 months   . Chest Pain    c/o none  . Edema    c/o non  . Shortness of Breath    c/o none    HPI: Jonathan Clayton is a 50 y.o. male with a PMH below who presents today for 6 month f/u of CAD-MI-PCI (07/2014) with Moderate Ischemic CM that essentially resolved by his most recent Echo in March 2016.   Echo in 02/2015 - mild concentric hypertrophy. Systolic function was normal. The estimated ejection fraction was in the range of 55% to 60%. There is dyskinesis of the apical myocardium. Doppler parameters are consistent with abnormal left ventricular relaxation (grade 1 diastolic dysfunction). Doppler parameters are consistent with high ventricular filling pressure  Jonathan Clayton was last seen in Aug 2016.  Recent Hospitalizations: none  Studies Reviewed:   Echocardiogram March 2016: Mild concentric LVH, EF 55-60%. Dyskinesis of apical wall. GR 1 DD  Interval History: Jonathan Clayton presents today overall doing okay from a cardiac standpoint. He is doing some exercises, but is really limited by his diabetic foot pain. He is low but bummed out because he still has not yet found a job. He had a temporary job from October to December, but that ended when he had some digestive issues the end of that timeframe. He eventually ended up probably being diagnosed with irritable bowel syndrome and was placed on Bentyl. In part because unable to exercise and because he has been a little bit depressed, he is put on some weight. He is really limited as far as exercise not just because of his diabetic neuropathic foot pain is also having some significant back pain issues.he says he has not really been taking his Lasix very often, but has noted some swelling and some mild orthopnea. He has not had any resting or exertional chest tightness/pressure to suggest angina. No rapid irregular heartbeats or palpitations   No  weakness, syncope/near syncope, or  TIA/amaurosis fugax symptoms. No melena, hematochezia, hematuria, or epstaxis. No claudication.  ROS: A comprehensive was performed. Review of Systems  Constitutional: Negative for weight loss, malaise/fatigue and diaphoresis.  HENT: Negative for hearing loss.   Cardiovascular: Negative for claudication.  Gastrointestinal: Positive for abdominal pain, diarrhea and constipation.       Symptoms that sound a lot like IBS  Genitourinary: Negative.   Musculoskeletal: Positive for back pain. Negative for myalgias.       Foot pain  Neurological: Negative for dizziness and headaches.       Neuropathic foot pain  Endo/Heme/Allergies: Negative.   All other systems reviewed and are negative.   Past Medical History  Diagnosis Date  . STEMI of inferolateral wall- 08/07/14 08/07/2014  . CAD S/P PCI mLAD & Cx-OM (Promus DES) 08/07/2014    mLAD & D2 100% - PCI to LAD w/ Promus P DES 2.25 x 20 & 8 (2.4), D2 Med Rx; Cx-OM tandem 80% -> PCI Promus P DES 2.75 x 16 & 3.0 x 24 (3 mm distal & 3.25 mm prox)  . Cardiomyopathy, ischemic-EF 40% by echo - Resolved  09/01/2014    Echo: 8/30: Moderate LVH, EF roughly 40%. Pseudo-normal Gr2 DD, apical akinesis, moderate HK of apical inferior mid inferolateral and anterolateral as well as apical septal and apical lateral walls with mild HK of apical anterior   . Chronic diastolic heart failure (HCC)     Echo  02/2015: Mild Conc LVH - Gr 1 DD with high filling pressures, EF 55-60% dyskinesis of the apical myocardium  . Essential hypertension, benign   . Dyslipidemia with high LDL and low HDL   . 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 GERD (gastroesophageal reflux disease)   . Osteoarthritis of back     With chronic low back pain  . Hx of completed stroke February 17 2010    Past Surgical History  Procedure Laterality Date  . Percutaneous coronary  stent intervention (pci-s)  08/07/2014     PCI-mCx-OM2 tandem 80% - Promus P DES 2.75 mm x 16 mm and 3.0 mm x 24 mm (3 mm distal & 3.25 mm prox); Mid LAD 100% - 2 Overlapping Promus P DES 2.25 mm x 20 mm & 2.25 mm x 8 mm (2.4 mm) only TIMI2 apical flow, Occluded D2 (not intervened on)  . Left heart catheterization with coronary angiogram N/A 08/07/2014    Procedure: LEFT HEART CATHETERIZATION WITH CORONARY ANGIOGRAM;  Surgeon: Marykay Lex, MD;  Location: [facility] CATH LAB;  Service: Cardiovascular;  Laterality: N/A;  . Transthoracic echocardiogram  07/13/2014; 02/2015    Moderate LVH, EF roughly 40%. Pseudo-normal Gr2 DD, apical akinesis, moderate HK of apical inferior mid inferolateral and anterolateral as well as apical septal and apical lateral walls with mild HK of apical anterior;; f/u: EF 55-60%, Mild LVH, Apical dyskinesis, Gr 1 DD with High Filling Pressures.   Prior to Admission medications   Medication Sig Start Date End Date Taking? Authorizing Provider  acetaminophen (TYLENOL) 500 MG tablet Take 1,000 mg by mouth every 6 (six) hours as needed for moderate pain.   Yes Historical Provider, MD  aspirin 81 MG chewable tablet Chew 1 tablet (81 mg total) by mouth daily. 08/11/14  Yes Brittainy Sherlynn Carbon, PA-C  atorvastatin (LIPITOR) 40 MG tablet Take 1 tablet (40 mg total) by mouth daily at 6 PM. 09/15/15  Yes Esperanza Richters, PA-C  carvedilol (COREG) 25 MG tablet Take 1 tablet (25 mg total) by mouth 2 (two) times daily with a meal. 09/16/15  Yes Abelino Derrick, PA-C  clopidogrel (PLAVIX) 75 MG tablet Take 1 tablet (75 mg total) by mouth daily. 08/06/15  Yes Marykay Lex, MD  dicyclomine (BENTYL) 10 MG capsule Take 1 capsule (10 mg total) by mouth 3 (three) times daily before meals. Patient taking differently: Take 20 mg by mouth 3 (three) times daily before meals.  01/06/16  Yes Edward Saguier, PA-C  furosemide (LASIX) 20 MG tablet Take 1 tablet (20 mg total) by mouth daily. 08/06/15  Yes Marykay Lex, MD  gabapentin (NEURONTIN) 600 MG tablet Take 1 tablet (600 mg total) by mouth 2 (two) times daily. 03/04/15  Yes Edward Saguier, PA-C  glucose blood (RELION GLUCOSE TEST STRIPS) test strip 1 each by Other route 2 (two) times daily. And lancets 2/day 250.01 09/28/14  Yes Romero Belling, MD  Insulin NPH, Human,, Isophane, (HUMULIN N KWIKPEN) 100 UNIT/ML Kiwkpen Inject 120 Units into the skin every morning. And pen needles 2/day 12/30/15  Yes Romero Belling, MD  lisinopril (PRINIVIL,ZESTRIL) 20 MG tablet Take 1 tablet (20 mg total) by mouth daily. 06/10/15  Yes Edward Saguier, PA-C  nitroGLYCERIN (NITROSTAT) 0.4 MG SL tablet Place 1 tablet (0.4 mg total) under the tongue every 5 (five) minutes as needed for chest pain. 08/11/14  Yes Brittainy Sherlynn Carbon, PA-C  nystatin cream (MYCOSTATIN) Apply 1 application topically 2 (two) times daily. 03/10/15  Yes Edward Saguier, PA-C  ranitidine (ZANTAC) 75 MG tablet Take 75 mg by mouth 2 (two) times daily as needed for heartburn.    Yes Historical Provider, MD  traMADol (ULTRAM) 50 MG tablet 1 tab po q 12 hours prn pain 01/06/16  Yes Esperanza Richters, PA-C    No Known Allergies   Social History   Social History  . Marital Status: Married    Spouse Name: N/A  . Number of Children: N/A  . Years of Education: N/A   Social History Main Topics  . Smoking status: Never Smoker   . Smokeless tobacco: None  . Alcohol Use: No  . Drug Use: No  . Sexual Activity: Yes   Other Topics Concern  . None   Social History Narrative   Married. Does not smoke -- never smoked.   He is currently doing cardiac rehabilitation   Family History  Problem Relation Age of Onset  . Cancer Father   . Diabetes Neg Hx     Wt Readings from Last 3 Encounters:  02/10/16 248 lb (112.492 kg)  01/06/16 242 lb (109.77 kg)  12/30/15 241 lb 9.6 oz (109.589 kg)    PHYSICAL EXAM BP 122/84 mmHg  Pulse 74  Ht 5\' 11"  (1.803 m)  Wt 248 lb (112.492 kg)  BMI 34.60 kg/m2 General  appearance: alert, cooperative, appears stated age, no distress and mildly obese Neck: no adenopathy, no carotid bruit and no JVD Lungs: CTAB, normal percussion bilaterally and non-labored Heart: RRR, S1&S2 normal, soft S4 no murmur, click, rub; nondisplaced PMI Abdomen: soft, non-tender; bowel sounds normal; no masses, no organomegaly; mild truncal obesity  Extremities: extremities normal, atraumatic, no cyanosis, or edema Pulses: 2+ and symmetric;  Skin: normal and With the exception of the healing bug bite on his left calf.  Neurologic: Mental status: Alert, oriented, thought content appropriate Cranial nerves: normal (II-XII grossly intact)    Adult ECG Report  Rate: 74 ;  Rhythm: normal sinus rhythm and Poor R-wave progression with nonspecific ST and T wave inversions in 1 and aVL. 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 This was despite a relatively late presentation. No active heart failure symptoms besides a little of edema and probably from diastolic dysfunction.      Relevant Medications   lisinopril (PRINIVIL,ZESTRIL) 20 MG tablet   carvedilol (COREG) 25 MG tablet   atorvastatin (LIPITOR) 40 MG tablet   furosemide (LASIX) 40 MG tablet   Other Relevant Orders   EKG 12-Lead   Lipid panel   Comprehensive metabolic panel   Obesity (BMI 16-10.9) (Chronic)    The patient understands the need to lose weight with diet and exercise. We  have discussed specific strategies for this.      Relevant Orders   EKG 12-Lead   Lipid panel   Comprehensive metabolic panel   Essential hypertension, benign (Chronic)  Excellent control on current medications. No changes.      Relevant Medications   lisinopril (PRINIVIL,ZESTRIL) 20 MG tablet   carvedilol (COREG) 25 MG tablet   atorvastatin (LIPITOR) 40 MG tablet   furosemide (LASIX) 40 MG tablet   Other Relevant Orders   EKG 12-Lead   Lipid panel   Comprehensive metabolic panel   Dyslipidemia with high LDL and low HDL (Chronic)    LDL continues to improve, however HDL is low and total cholesterol is high., Discussed importance of exercise and diabetic control because the triglycerides are elevated as well.      Relevant Medications   atorvastatin (LIPITOR) 40 MG tablet   Other Relevant Orders   EKG 12-Lead   Lipid panel   Comprehensive metabolic panel   Chronic diastolic heart failure (HCC)    He is on 20 mg Lasix now what he has not taken in a while. I will change it to 40 mg into the taken about 3-4 days out of the week and as needed. Once he is on ACE inhibitor and carvedilol for afterload reduction and rate control.      Relevant Medications   lisinopril (PRINIVIL,ZESTRIL) 20 MG tablet   carvedilol (COREG) 25 MG tablet   atorvastatin (LIPITOR) 40 MG tablet   furosemide (LASIX) 40 MG tablet   Other Relevant Orders   EKG 12-Lead   Lipid panel   Comprehensive metabolic panel   Cardiomyopathy, ischemic-EF 40% by echo  (Chronic)   Relevant Medications   lisinopril (PRINIVIL,ZESTRIL) 20 MG tablet   carvedilol (COREG) 25 MG tablet   atorvastatin (LIPITOR) 40 MG tablet   furosemide (LASIX) 40 MG tablet   Other Relevant Orders   EKG 12-Lead   Lipid panel   Comprehensive metabolic panel   CAD S/P PCI mLAD & Cx-OM (Promus DES) - Primary (Chronic)    About 18 months out from his MI. Doing well. He did have a diagonal lesion which are treating medically. No recurrent  symptoms. He is now on Plavix plus aspirin.  He is on max dose of carvedilol along with lisinopril at moderate dose. We have him now on 40 mg of atorvastatin which was an increase after his last labs noted above.  We will recheck his labs in a few weeks. Needs aggressive diabetic control by his PCP.      Relevant Medications   lisinopril (PRINIVIL,ZESTRIL) 20 MG tablet   carvedilol (COREG) 25 MG tablet   atorvastatin (LIPITOR) 40 MG tablet   furosemide (LASIX) 40 MG tablet   Other Relevant Orders   EKG 12-Lead   Lipid panel   Comprehensive metabolic panel    Other Visit Diagnoses    Drug therapy        Relevant Orders    Lipid panel    Comprehensive metabolic panel       Current medicines are reviewed at length with the patient today. (+/- concerns) none The following changes have been made: none Studies Ordered:   Orders Placed This Encounter  Procedures  . Lipid panel  . Comprehensive metabolic panel  . EKG 12-Lead      Marykay Lex, M.D., M.S. Interventional Cardiologist   Pager # [phone] Phone # [phone] [address] [place], [place] [address]

## 2016-02-10 NOTE — Patient Instructions (Signed)
Need labs done - LIPID , CMP - NOTHING TO EAT OR DRINK THE MORNING OF TEST  CHANGE LASIX ( FUROSEMIDE)  DOSE   ---- 40 MG  EVERY Monday - Wednesday - Fridays , tho there days take if need for shortness of breathe and/or swelling.  You may hold Plavix  ( clopidogrel)  for procedures but take 81 mg of aspirin those days until you restart Plavix ( clopidogrel)    Medication refilled.  Your physician wants you to follow-up in12 months with Dr Herbie Baltimore.  You will receive a reminder letter in the mail two months in advance. If you don't receive a letter, please call our office to schedule the follow-up appointment.  If you need a refill on your cardiac medications before your next appointment, please call your pharmacy.

## 2016-02-12 ENCOUNTER — Encounter: Payer: Self-pay | Admitting: Cardiology

## 2016-02-12 NOTE — Assessment & Plan Note (Signed)
He is on 20 mg Lasix now what he has not taken in a while. I will change it to 40 mg into the taken about 3-4 days out of the week and as needed. Once he is on ACE inhibitor and carvedilol for afterload reduction and rate control.

## 2016-02-12 NOTE — Assessment & Plan Note (Signed)
Pretty good recovery of function following revascularization. This was despite a relatively late presentation. No active heart failure symptoms besides a little of edema and probably from diastolic dysfunction.

## 2016-02-12 NOTE — Assessment & Plan Note (Signed)
About 18 months out from his MI. Doing well. He did have a diagonal lesion which are treating medically. No recurrent symptoms. He is now on Plavix plus aspirin.  He is on max dose of carvedilol along with lisinopril at moderate dose. We have him now on 40 mg of atorvastatin which was an increase after his last labs noted above.  We will recheck his labs in a few weeks. Needs aggressive diabetic control by his PCP.

## 2016-02-12 NOTE — Assessment & Plan Note (Signed)
LDL continues to improve, however HDL is low and total cholesterol is high., Discussed importance of exercise and diabetic control because the triglycerides are elevated as well.

## 2016-02-12 NOTE — Assessment & Plan Note (Signed)
Excellent control on current medications. No changes.

## 2016-02-12 NOTE — Assessment & Plan Note (Signed)
The patient understands the need to lose weight with diet and exercise. We have discussed specific strategies for this.  

## 2016-02-13 ENCOUNTER — Telehealth: Payer: Self-pay | Admitting: Medical

## 2016-02-13 NOTE — Telephone Encounter (Signed)
Would you abstract that he is using carvedilol. He has history of chf. Also will you looks a this last echo. Abstract his echo results. Let me know if you don't know how to abstract these or ask other MA.

## 2016-02-14 NOTE — Addendum Note (Signed)
Addended by: Neldon Labella on: 02/14/2016 08:33 AM   Modules accepted: Medications

## 2016-02-21 ENCOUNTER — Encounter: Payer: BLUE CROSS/BLUE SHIELD | Admitting: Internal Medicine

## 2016-02-24 ENCOUNTER — Telehealth: Payer: Self-pay | Admitting: Medical

## 2016-02-24 NOTE — Telephone Encounter (Signed)
Pt dropped off paperwork to be filled out (Application for Permanent Disability Parking Placard)

## 2016-02-25 NOTE — Telephone Encounter (Signed)
Received Application for Renewal of Disability Parking Placard; forwarded to provider/SLS 03/16

## 2016-02-25 NOTE — Telephone Encounter (Signed)
LMOM with contact name and number [for return call, if needed RE: form is ready for p/u during regular business hours/SLS 03/17

## 2016-02-27 DIAGNOSIS — E119 Type 2 diabetes mellitus without complications: Secondary | ICD-10-CM | POA: Insufficient documentation

## 2016-02-27 NOTE — Progress Notes (Signed)
Subjective:    Patient ID: Jonathan Clayton, male    DOB: 08-Oct-1966, 50 y.o.   MRN: [id number]  HPI Pt returns for f/u of diabetes mellitus: DM type: Insulin-requiring type 2 Dx'ed: 2000 Complications: painful neuropathy of the lower extremities, CAD, CVA, and renal insufficiency.  Therapy: insulin since 2010. DKA: never Severe hypoglycemia: never. Pancreatitis: never.  Other: due to h/o noncompliance, he is on qd insulin.  Interval history: Pt says he takes the insulin as rx'ed.  He was changed to NPH, but he could not afford, and he declines syringe and vial.  no cbg record, but states cbg's vary from 96-170.  It is in general higher as the day goes on.  pt states he feels well in general.  Past Medical History  Diagnosis Date  . STEMI of inferolateral wall- 08/07/14 08/07/2014  . CAD S/P PCI mLAD & Cx-OM (Promus DES) 08/07/2014    mLAD & D2 100% - PCI to LAD w/ Promus P DES 2.25 x 20 & 8 (2.4), D2 Med Rx; Cx-OM tandem 80% -> PCI Promus P DES 2.75 x 16 & 3.0 x 24 (3 mm distal & 3.25 mm prox)  . Cardiomyopathy, ischemic-EF 40% by echo - Resolved  09/01/2014    Echo: 8/30: Moderate LVH, EF roughly 40%. Pseudo-normal Gr2 DD, apical akinesis, moderate HK of apical inferior mid inferolateral and anterolateral as well as apical septal and apical lateral walls with mild HK of apical anterior   . Chronic diastolic heart failure (HCC)     Echo 02/2015: Mild Conc LVH - Gr 1 DD with high filling pressures, EF 55-60% dyskinesis of the apical myocardium  . Essential hypertension, benign   . Dyslipidemia with high LDL and low HDL   . 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 GERD (gastroesophageal reflux disease)   . Osteoarthritis of back     With chronic low back pain  . Hx of completed stroke February 17 2010    Past Surgical History  Procedure Laterality Date  . Percutaneous coronary stent intervention (pci-s)   08/07/2014     PCI-mCx-OM2 tandem 80% - Promus P DES 2.75 mm x 16 mm and 3.0 mm x 24 mm (3 mm distal & 3.25 mm prox); Mid LAD 100% - 2 Overlapping Promus P DES 2.25 mm x 20 mm & 2.25 mm x 8 mm (2.4 mm) only TIMI2 apical flow, Occluded D2 (not intervened on)  . Left heart catheterization with coronary angiogram N/A 08/07/2014    Procedure: LEFT HEART CATHETERIZATION WITH CORONARY ANGIOGRAM;  Surgeon: Marykay Lex, MD;  Location: [facility] CATH LAB;  Service: Cardiovascular;  Laterality: N/A;  . Transthoracic echocardiogram  07/13/2014; 02/2015    Moderate LVH, EF roughly 40%. Pseudo-normal Gr2 DD, apical akinesis, moderate HK of apical inferior mid inferolateral and anterolateral as well as apical septal and apical lateral walls with mild HK of apical anterior;; f/u: EF 55-60%, Mild LVH, Apical dyskinesis, Gr 1 DD with High Filling Pressures.    Social History   Social History  . Marital Status: Married    Spouse Name: N/A  . Number of Children: N/A  . Years of Education: N/A   Occupational History  . Not on file.   Social History Main Topics  . Smoking status: Never Smoker   . Smokeless tobacco: Not on file  . Alcohol Use: No  . Drug Use: No  .  Sexual Activity: Yes   Other Topics Concern  . Not on file   Social History Narrative   Married. Does not smoke -- never smoked.   He is currently doing cardiac rehabilitation    Current Outpatient Prescriptions on File Prior to Visit  Medication Sig Dispense Refill  . acetaminophen (TYLENOL) 500 MG tablet Take 1,000 mg by mouth every 6 (six) hours as needed for moderate pain.    Marland Kitchen aspirin 81 MG chewable tablet Chew 1 tablet (81 mg total) by mouth daily.    Marland Kitchen atorvastatin (LIPITOR) 40 MG tablet Take 1 tablet (40 mg total) by mouth daily at 6 PM. 30 tablet 11  . carvedilol (COREG) 25 MG tablet Take 1 tablet (25 mg total) by mouth 2 (two) times daily with a meal. 60 tablet 11  . clopidogrel (PLAVIX) 75 MG tablet Take 1 tablet (75 mg total) by  mouth daily. 30 tablet 11  . dicyclomine (BENTYL) 10 MG capsule Take 1 capsule (10 mg total) by mouth 3 (three) times daily before meals. (Patient taking differently: Take 20 mg by mouth 3 (three) times daily before meals. ) 90 capsule 2  . furosemide (LASIX) 40 MG tablet TAKE 40 MG ON MONDAY ,WEDNESDAY, FRIDAYS, MAY TAKE AS NEEDED WITH SWELLING AND OR SHORTNESS OF BREATHE 30 tablet 11  . gabapentin (NEURONTIN) 600 MG tablet Take 1 tablet (600 mg total) by mouth 2 (two) times daily. 60 tablet 3  . lisinopril (PRINIVIL,ZESTRIL) 20 MG tablet Take 1 tablet (20 mg total) by mouth daily. 30 tablet 11  . nitroGLYCERIN (NITROSTAT) 0.4 MG SL tablet Place 1 tablet (0.4 mg total) under the tongue every 5 (five) minutes as needed for chest pain. 25 tablet 2  . nystatin cream (MYCOSTATIN) Apply 1 application topically 2 (two) times daily. 30 g 0  . ranitidine (ZANTAC) 75 MG tablet Take 75 mg by mouth 2 (two) times daily as needed for heartburn.     . traMADol (ULTRAM) 50 MG tablet 1 tab po q 12 hours prn pain 30 tablet 0   No current facility-administered medications on file prior to visit.    No Known Allergies  Family History  Problem Relation Age of Onset  . Cancer Father   . Diabetes Neg Hx     BP 122/78 mmHg  Pulse 78  Temp(Src) 98.2 F (36.8 C) (Oral)  Ht 5\' 11"  (1.803 m)  Wt 246 lb (111.585 kg)  BMI 34.33 kg/m2  SpO2 98%  Review of Systems He says he may have hypoglycemia once every 2 weeeks or so, but he does not check then.      Objective:   Physical Exam VITAL SIGNS:  See vs page GENERAL: no distress.  Pulses: dorsalis pedis intact bilat.  MSK: no deformity of the feet.  CV: 1+ bilat leg edema.  Skin: no ulcer on the feet. normal color and temp on the feet. Neuro: sensation is intact to touch on the feet.     A1c=10.6%    Assessment & Plan:  DM: The pattern of his cbg's indicates he needs a faster-acting QD insulin.   Patient is advised the following: Patient  Instructions  check your blood sugar twice a day.  vary the time of day when you check, between before the 3 meals, and at bedtime.  also check if you have symptoms of your blood sugar being too high or too low.  please keep a record of the readings and bring it to your next appointment  here.  You can write it on any piece of paper.  please call us sooner if your blood sugar goes below 70, or if you have a lot of readings over 200.     Please change your current insulin to "humalog 75/25," 130 units with breakfast.   On this type of insulin schedule, you should eat meals on a regular schedule.  If a meal is missed or significantly delayed, your blood sugar could go low.  Please come back for a follow-up appointment in 2 months.

## 2016-02-28 ENCOUNTER — Ambulatory Visit (INDEPENDENT_AMBULATORY_CARE_PROVIDER_SITE_OTHER): Payer: BLUE CROSS/BLUE SHIELD | Admitting: Endocrinology

## 2016-02-28 VITALS — BP 122/78 | HR 78 | Temp 98.2°F | Ht 71.0 in | Wt 246.0 lb

## 2016-02-28 DIAGNOSIS — N183 Chronic kidney disease, stage 3 (moderate): Secondary | ICD-10-CM

## 2016-02-28 DIAGNOSIS — Z794 Long term (current) use of insulin: Secondary | ICD-10-CM

## 2016-02-28 DIAGNOSIS — E1122 Type 2 diabetes mellitus with diabetic chronic kidney disease: Secondary | ICD-10-CM

## 2016-02-28 LAB — POCT GLYCOSYLATED HEMOGLOBIN (HGB A1C): HEMOGLOBIN A1C: 10.6

## 2016-02-28 MED ORDER — GLUCOSE BLOOD VI STRP: 1.0000 | ORAL_STRIP | Freq: Two times a day (BID) | Status: AC

## 2016-02-28 MED ORDER — INSULIN LISPRO PROT & LISPRO (75-25 MIX) 100 UNIT/ML KWIKPEN: 130.0000 [IU] | PEN_INJECTOR | Freq: Every day | SUBCUTANEOUS | Status: AC

## 2016-02-28 NOTE — Patient Instructions (Addendum)
check your blood sugar twice a day.  vary the time of day when you check, between before the 3 meals, and at bedtime.  also check if you have symptoms of your blood sugar being too high or too low.  please keep a record of the readings and bring it to your next appointment here.  You can write it on any piece of paper.  please call us sooner if your blood sugar goes below 70, or if you have a lot of readings over 200.     Please change your current insulin to "humalog 75/25," 130 units with breakfast.   On this type of insulin schedule, you should eat meals on a regular schedule.  If a meal is missed or significantly delayed, your blood sugar could go low.  Please come back for a follow-up appointment in 2 months.

## 2016-03-06 ENCOUNTER — Other Ambulatory Visit: Payer: Self-pay | Admitting: Medical

## 2016-03-07 ENCOUNTER — Telehealth: Payer: Self-pay | Admitting: Medical

## 2016-03-07 ENCOUNTER — Encounter: Payer: Self-pay | Admitting: Medical

## 2016-03-07 DIAGNOSIS — N289 Disorder of kidney and ureter, unspecified: Secondary | ICD-10-CM

## 2016-03-07 NOTE — Telephone Encounter (Signed)
Last filled: 03/06/16 Amt: 90,0  Med already filled.

## 2016-03-08 NOTE — Telephone Encounter (Signed)
error:[id number] ° °

## 2016-03-09 MED ORDER — DICYCLOMINE HCL 10 MG PO CAPS: 20.0000 mg | ORAL_CAPSULE | Freq: Three times a day (TID) | ORAL | Status: DC

## 2016-03-09 NOTE — Telephone Encounter (Addendum)
Verbal order given to send new Rx for 20 mg (2 capsules) three times per day.  Also for a repeat of CMP within the next month if patient was not taking 2 capsule three times a day during the time he came in for labs on 12/16/15.    New Rx sent.  Pt notified and made aware.  He said that dose was not taken as such during that time.  Lab appt scheduled for 03/23/16. Future lab ordered.

## 2016-03-09 NOTE — Telephone Encounter (Signed)
Patient is currently at the pharmacy, pharmacy advising patient script hasn't been received. Patient states he has gone a day without and is very upset. Patient would like a call when office contacts pharmacy to ensure it was taken care of. Advised patient PA is in clinic and will follow up.

## 2016-03-09 NOTE — Addendum Note (Signed)
Addended by: Tylene Fantasia on: 03/09/2016 11:37 AM   Modules accepted: Orders

## 2016-03-09 NOTE — Telephone Encounter (Signed)
Called pharmacy.  They verified that Rx had been sent and that they received it.  Called patient, making him aware of the same.  He was still at the pharmacy during the call.  He spoke with pharm tech.  Then returned back to the phone.  He said that the Rx was not written correctly.  Per patient, he has been taking 2 dicyclomine three times per day and not one time per day.  He says that this has been working well for him and he mentioned this to [employer], PA-C at last office visit (01/06/16).  Ramon Dredge out of the office today.  Message routed to Dr. Abner Greenspan to see if she would be able to address concern in PCP's absence.

## 2016-03-11 NOTE — Telephone Encounter (Signed)
Pt states he is taking bentyl 20 mg three times a day. So you can call send in rx #  60 tabs with 3 refills. He states in my chart  message some problems with [facility] stating they never got rx although our office states it was sent. Will you check into this. I think his pharmacy is at [address].

## 2016-03-23 ENCOUNTER — Other Ambulatory Visit: Payer: BLUE CROSS/BLUE SHIELD

## 2016-03-28 ENCOUNTER — Other Ambulatory Visit (INDEPENDENT_AMBULATORY_CARE_PROVIDER_SITE_OTHER): Payer: BLUE CROSS/BLUE SHIELD

## 2016-03-28 DIAGNOSIS — N289 Disorder of kidney and ureter, unspecified: Secondary | ICD-10-CM

## 2016-03-28 LAB — COMPREHENSIVE METABOLIC PANEL
ALK PHOS: 74 U/L (ref 39–117)
ALT: 17 U/L (ref 0–53)
AST: 15 U/L (ref 0–37)
Albumin: 4 g/dL (ref 3.5–5.2)
BUN: 30 mg/dL — ABNORMAL HIGH (ref 6–23)
CHLORIDE: 108 meq/L (ref 96–112)
CO2: 29 mEq/L (ref 19–32)
Calcium: 9.5 mg/dL (ref 8.4–10.5)
Creatinine, Ser: 1.48 mg/dL (ref 0.40–1.50)
GFR: 53.29 mL/min — ABNORMAL LOW (ref >60.00)
Glucose, Bld: 67 mg/dL — ABNORMAL LOW (ref 70–99)
POTASSIUM: 4 meq/L (ref 3.5–5.1)
SODIUM: 143 meq/L (ref 135–145)
TOTAL PROTEIN: 7.5 g/dL (ref 6.0–8.3)
Total Bilirubin: 0.4 mg/dL (ref 0.2–1.2)

## 2016-03-28 NOTE — Progress Notes (Signed)
Quick Note:  Pt has seen results on MyChart and message also sent for patient to call back if any questions. ______ 

## 2016-03-29 ENCOUNTER — Encounter: Payer: Self-pay | Admitting: Medical

## 2016-03-29 ENCOUNTER — Other Ambulatory Visit: Payer: Self-pay | Admitting: Medical

## 2016-03-30 MED ORDER — GABAPENTIN 600 MG PO TABS: 600.0000 mg | ORAL_TABLET | Freq: Two times a day (BID) | ORAL | Status: DC

## 2016-04-05 ENCOUNTER — Ambulatory Visit: Payer: BLUE CROSS/BLUE SHIELD | Admitting: Medical

## 2016-04-06 ENCOUNTER — Encounter: Payer: Self-pay | Admitting: Medical

## 2016-04-06 ENCOUNTER — Ambulatory Visit (INDEPENDENT_AMBULATORY_CARE_PROVIDER_SITE_OTHER): Payer: BLUE CROSS/BLUE SHIELD | Admitting: Medical

## 2016-04-06 VITALS — BP 120/80 | HR 78 | Temp 98.0°F | Ht 71.0 in | Wt 248.8 lb

## 2016-04-06 DIAGNOSIS — Z1211 Encounter for screening for malignant neoplasm of colon: Secondary | ICD-10-CM

## 2016-04-06 DIAGNOSIS — E1165 Type 2 diabetes mellitus with hyperglycemia: Secondary | ICD-10-CM

## 2016-04-06 DIAGNOSIS — K589 Irritable bowel syndrome without diarrhea: Secondary | ICD-10-CM | POA: Diagnosis not present

## 2016-04-06 DIAGNOSIS — E785 Hyperlipidemia, unspecified: Secondary | ICD-10-CM

## 2016-04-06 DIAGNOSIS — M48061 Spinal stenosis, lumbar region without neurogenic claudication: Secondary | ICD-10-CM

## 2016-04-06 DIAGNOSIS — I10 Essential (primary) hypertension: Secondary | ICD-10-CM

## 2016-04-06 DIAGNOSIS — M4806 Spinal stenosis, lumbar region: Secondary | ICD-10-CM

## 2016-04-06 DIAGNOSIS — Z794 Long term (current) use of insulin: Secondary | ICD-10-CM

## 2016-04-06 MED ORDER — ATORVASTATIN CALCIUM 40 MG PO TABS: 40.0000 mg | ORAL_TABLET | Freq: Every day | ORAL | Status: DC

## 2016-04-06 MED ORDER — DICYCLOMINE HCL 10 MG PO CAPS: 20.0000 mg | ORAL_CAPSULE | Freq: Three times a day (TID) | ORAL | Status: AC

## 2016-04-06 MED ORDER — GABAPENTIN 600 MG PO TABS: 600.0000 mg | ORAL_TABLET | Freq: Two times a day (BID) | ORAL | Status: DC

## 2016-04-06 NOTE — Progress Notes (Signed)
Pre visit review using our clinic review tool, if applicable. No additional management support is needed unless otherwise documented below in the visit note. 

## 2016-04-06 NOTE — Patient Instructions (Addendum)
For your back pain, bulging disc and foraminal stenosis, I will refer you to neurosurgeon.  For ibs refilled your bentyl.  For hyprlipidemia refilled your lipitor. Come in fasting next visit. Will check lipid panel  For diabetes continue to see endocrinologist. If you have any further low sugar episodes contact endocrinologist.   Your bp is well controlled continue current bp med regimen.  Follow up in 3 months or as needed.

## 2016-04-06 NOTE — Progress Notes (Signed)
Subjective:    Patient ID: Jonathan Clayton, male    DOB: 1966-07-22, 50 y.o.   MRN: [id number]  HPI   Pt in for follow up.   Pt in states that his back still hurts. Pt did see Dr. Gerlene Fee. Pt states he was told by neurosurgeon that he does not do surgery anymore. Pt states it was indicated to him that surgery was option. Pt states he was told they pt needs to pay his outstanding balance to Dr Gerlene Fee before Dr. Gerlene Fee refers him. Pt has lower back pain and pain that occasionally shoots down his legs. He is on gabapentin. Also has tramadol but he states makes him drowsy. Pt states he is not sure if it is worth since he may have to come up with money before the surgery. And he will have to be in patient as well for couple of days per Dr. Gerlene Fee.  Pt is seeing his endocrinologist. Pt is on insulin. Pt can afford toujeo. Pt a1c is better than it was but still high. Pt get occasional low sugar 60-70 in am. But this is rare. Pt states some reading now in 100's in am. In past sugars were over 200 in the am.  Pt has some ibs type symptoms in past. Pt states bentyl does help his symptoms. On review today no colon cancer history in family. Pt has not had colonoscopy before.    Review of Systems  Constitutional: Negative for chills and fatigue.  HENT: Negative for congestion, drooling, ear pain, postnasal drip and sinus pressure.   Respiratory: Negative for cough, chest tightness, shortness of breath and wheezing.   Cardiovascular: Negative for chest pain and palpitations.  Gastrointestinal: Negative for abdominal pain, diarrhea, constipation and rectal pain.  Musculoskeletal: Negative for back pain.  Neurological: Negative for dizziness and light-headedness.  Hematological: Negative for adenopathy. Does not bruise/bleed easily.  Psychiatric/Behavioral: Negative for behavioral problems and confusion.    Past Medical History  Diagnosis Date  . STEMI of inferolateral wall- 08/07/14 08/07/2014  . CAD  S/P PCI mLAD & Cx-OM (Promus DES) 08/07/2014    mLAD & D2 100% - PCI to LAD w/ Promus P DES 2.25 x 20 & 8 (2.4), D2 Med Rx; Cx-OM tandem 80% -> PCI Promus P DES 2.75 x 16 & 3.0 x 24 (3 mm distal & 3.25 mm prox)  . Cardiomyopathy, ischemic-EF 40% by echo - Resolved  09/01/2014    Echo: 8/30: Moderate LVH, EF roughly 40%. Pseudo-normal Gr2 DD, apical akinesis, moderate HK of apical inferior mid inferolateral and anterolateral as well as apical septal and apical lateral walls with mild HK of apical anterior   . Chronic diastolic heart failure (HCC)     Echo 02/2015: Mild Conc LVH - Gr 1 DD with high filling pressures, EF 55-60% dyskinesis of the apical myocardium  . Essential hypertension, benign   . Dyslipidemia with high LDL and low HDL   . 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 GERD (gastroesophageal reflux disease)   . Osteoarthritis of back     With chronic low back pain  . Hx of completed stroke February 17 2010     Social History   Social History  . Marital Status: Married    Spouse Name: N/A  . Number of Children: N/A  . Years of Education: N/A   Occupational History  . Not on file.  Social History Main Topics  . Smoking status: Never Smoker   . Smokeless tobacco: Not on file  . Alcohol Use: No  . Drug Use: No  . Sexual Activity: Yes   Other Topics Concern  . Not on file   Social History Narrative   Married. Does not smoke -- never smoked.   He is currently doing cardiac rehabilitation    Past Surgical History  Procedure Laterality Date  . Percutaneous coronary stent intervention (pci-s)  08/07/2014     PCI-mCx-OM2 tandem 80% - Promus P DES 2.75 mm x 16 mm and 3.0 mm x 24 mm (3 mm distal & 3.25 mm prox); Mid LAD 100% - 2 Overlapping Promus P DES 2.25 mm x 20 mm & 2.25 mm x 8 mm (2.4 mm) only TIMI2 apical flow, Occluded D2 (not intervened on)  . Left heart catheterization with coronary  angiogram N/A 08/07/2014    Procedure: LEFT HEART CATHETERIZATION WITH CORONARY ANGIOGRAM;  Surgeon: Marykay Lex, MD;  Location: [facility] CATH LAB;  Service: Cardiovascular;  Laterality: N/A;  . Transthoracic echocardiogram  07/13/2014; 02/2015    Moderate LVH, EF roughly 40%. Pseudo-normal Gr2 DD, apical akinesis, moderate HK of apical inferior mid inferolateral and anterolateral as well as apical septal and apical lateral walls with mild HK of apical anterior;; f/u: EF 55-60%, Mild LVH, Apical dyskinesis, Gr 1 DD with High Filling Pressures.    Family History  Problem Relation Age of Onset  . Cancer Father   . Diabetes Neg Hx     No Known Allergies  Current Outpatient Prescriptions on File Prior to Visit  Medication Sig Dispense Refill  . acetaminophen (TYLENOL) 500 MG tablet Take 1,000 mg by mouth every 6 (six) hours as needed for moderate pain.    Marland Kitchen aspirin 81 MG chewable tablet Chew 1 tablet (81 mg total) by mouth daily.    Marland Kitchen atorvastatin (LIPITOR) 40 MG tablet Take 1 tablet (40 mg total) by mouth daily at 6 PM. 30 tablet 11  . carvedilol (COREG) 25 MG tablet Take 1 tablet (25 mg total) by mouth 2 (two) times daily with a meal. 60 tablet 11  . clopidogrel (PLAVIX) 75 MG tablet Take 1 tablet (75 mg total) by mouth daily. 30 tablet 11  . dicyclomine (BENTYL) 10 MG capsule Take 2 capsules (20 mg total) by mouth 3 (three) times daily. 180 capsule 0  . furosemide (LASIX) 40 MG tablet TAKE 40 MG ON MONDAY ,WEDNESDAY, FRIDAYS, MAY TAKE AS NEEDED WITH SWELLING AND OR SHORTNESS OF BREATHE 30 tablet 11  . gabapentin (NEURONTIN) 600 MG tablet Take 1 tablet (600 mg total) by mouth 2 (two) times daily. 60 tablet 1  . glucose blood (RELION GLUCOSE TEST STRIPS) test strip 1 each by Other route 2 (two) times daily. And lancets 2/day 250.01 100 each 12  . Insulin Lispro Prot & Lispro (HUMALOG MIX 75/25 KWIKPEN) (75-25) 100 UNIT/ML Kwikpen Inject 130 Units into the skin daily with breakfast. And pen needles  3/day 45 mL 11  . lisinopril (PRINIVIL,ZESTRIL) 20 MG tablet Take 1 tablet (20 mg total) by mouth daily. 30 tablet 11  . nitroGLYCERIN (NITROSTAT) 0.4 MG SL tablet Place 1 tablet (0.4 mg total) under the tongue every 5 (five) minutes as needed for chest pain. 25 tablet 2  . nystatin cream (MYCOSTATIN) Apply 1 application topically 2 (two) times daily. 30 g 0  . ranitidine (ZANTAC) 75 MG tablet Take 75 mg by mouth 2 (two) times daily as  needed for heartburn.     . traMADol (ULTRAM) 50 MG tablet 1 tab po q 12 hours prn pain 30 tablet 0   No current facility-administered medications on file prior to visit.    BP 120/80 mmHg  Pulse 78  Temp(Src) 98 F (36.7 C) (Oral)  Ht 5\' 11"  (1.803 m)  Wt 248 lb 12.8 oz (112.855 kg)  BMI 34.72 kg/m2  SpO2 98%       Objective:   Physical Exam  General Mental Status- Alert. General Appearance- Not in acute distress.   Skin General: Color- Normal Color. Moisture- Normal Moisture.  Neck Carotid Arteries- Normal color. Moisture- Normal Moisture. No carotid bruits. No JVD.  Chest and Lung Exam Auscultation: Breath Sounds:-Normal.  Cardiovascular Auscultation:Rythm- Regular. Murmurs & Other Heart Sounds:Auscultation of the heart reveals- No Murmurs.  Abdomen Inspection:-Inspeection Normal. Palpation/Percussion:Note:No mass. Palpation and Percussion of the abdomen reveal- Non Tender, Non Distended + BS, no rebound or guarding.    Neurologic Cranial Nerve exam:- CN III-XII intact(No nystagmus), symmetric smile. Strength:- 5/5 equal and symmetric strength both upper and lower extremities.      Assessment & Plan:  For your back pain, bulging disc and foraminal stenosis, I will refer you to neurosurgeon.  For ibs refilled your bentyl.  For hyprlipidemia refilled your lipitor. Come in fasting next visit. Will check lipid panel  For diabetes continue to see endocrinologist. If you have any further low sugar episodes contact  endocrinologist.   Your bp is well controlled continue current bp med regimen.  I did place referral for colonosocopy today.  Follow up in 3 months or as needed.

## 2016-04-11 ENCOUNTER — Ambulatory Visit: Payer: BLUE CROSS/BLUE SHIELD | Admitting: Internal Medicine

## 2016-04-21 ENCOUNTER — Encounter: Payer: Self-pay | Admitting: Medical

## 2016-04-24 ENCOUNTER — Telehealth: Payer: Self-pay | Admitting: Medical

## 2016-04-24 ENCOUNTER — Encounter: Payer: Self-pay | Admitting: Medical

## 2016-04-24 NOTE — Telephone Encounter (Signed)
Attempted to contact patient to reschedule appointment with Esperanza Richters on July 11, 2016. Left message for patient to call and reschedule. Letter mailed and appointment cancelled.

## 2016-04-26 ENCOUNTER — Encounter: Payer: Self-pay | Admitting: Medical

## 2016-04-27 MED ORDER — GABAPENTIN 600 MG PO TABS: 600.0000 mg | ORAL_TABLET | Freq: Two times a day (BID) | ORAL | Status: AC

## 2016-04-27 MED ORDER — ATORVASTATIN CALCIUM 40 MG PO TABS
40.0000 mg | ORAL_TABLET | Freq: Every day | ORAL | Status: AC
Start: 2016-04-27 — End: ?

## 2016-04-27 NOTE — Telephone Encounter (Signed)
rx gabapentin and atorvastatin sent to the pharmacy.

## 2016-04-27 NOTE — Telephone Encounter (Signed)
Pt was sent to Dr. Petra Kuba. Will you call and see if they will see records from the last visit.

## 2016-05-01 ENCOUNTER — Ambulatory Visit: Payer: BLUE CROSS/BLUE SHIELD | Admitting: Endocrinology

## 2016-05-01 ENCOUNTER — Telehealth: Payer: Self-pay | Admitting: Endocrinology

## 2016-05-01 NOTE — Telephone Encounter (Signed)
Patient will not be coming to his appointment today, he passed away Apr 09, 2023. :(

## 2016-05-01 NOTE — Telephone Encounter (Signed)
See note below to be advised. 

## 2016-05-02 ENCOUNTER — Telehealth: Payer: Self-pay | Admitting: Medical

## 2016-05-02 NOTE — Telephone Encounter (Signed)
[employer] dropped off document to be filled out ASAP. Please call Mr. Jonathan Clayton [phone]) when document done.

## 2016-05-03 NOTE — Telephone Encounter (Signed)
Form was given to provider 05/03/16 and he filled out the information on the form. A copy was made to be placed in the patients chart.  A representative from the funeral home came by the office 05/03/16 and picked up the form. No further action is required at this time. //hsm, CMA

## 2016-05-03 NOTE — Telephone Encounter (Signed)
I filled out pt death certificate today. Talked with pt wife who found him on kitchen floor late last week. Per wife she was told by EMS he likely had been passed away for likely  2 hours. He had been ok per wife that morning. I put on death certificate liklely cause was MI. He had history of MI and multiple risk factor. I asked Francesco Runner to give form to [facility] and scan the copy of death certificate to epic.

## 2016-05-11 DEATH — deceased

## 2016-06-01 ENCOUNTER — Ambulatory Visit: Payer: BLUE CROSS/BLUE SHIELD | Admitting: Internal Medicine

## 2016-07-11 ENCOUNTER — Ambulatory Visit: Payer: BLUE CROSS/BLUE SHIELD | Admitting: Medical

## 2016-07-11 IMAGING — DX DG CHEST 2V
2 series · 2 of 2 positions shown · non-contrast
Comparison: Portable chest x-ray August 07, 2014

CLINICAL DATA: Shortness of breath since heart attack last year;
subsequent stent placement

EXAM:
CHEST  2 VIEW

[chest pa]
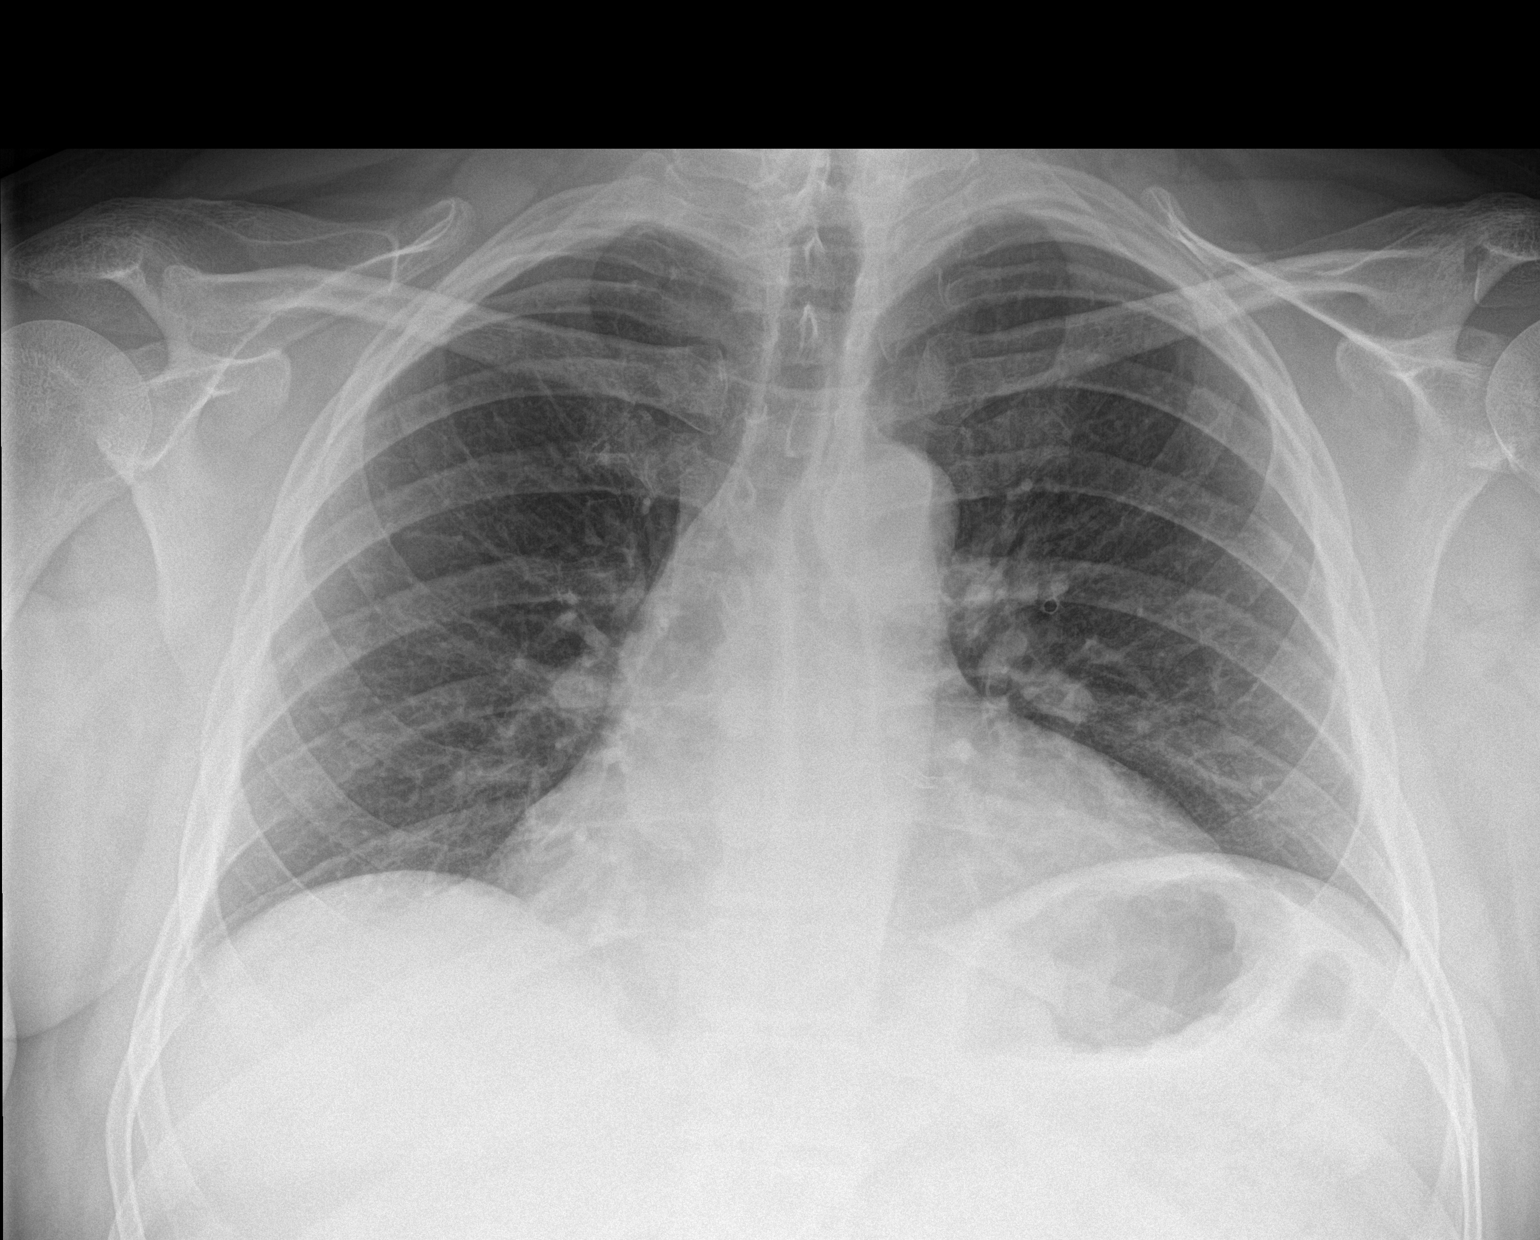

[chest lat]
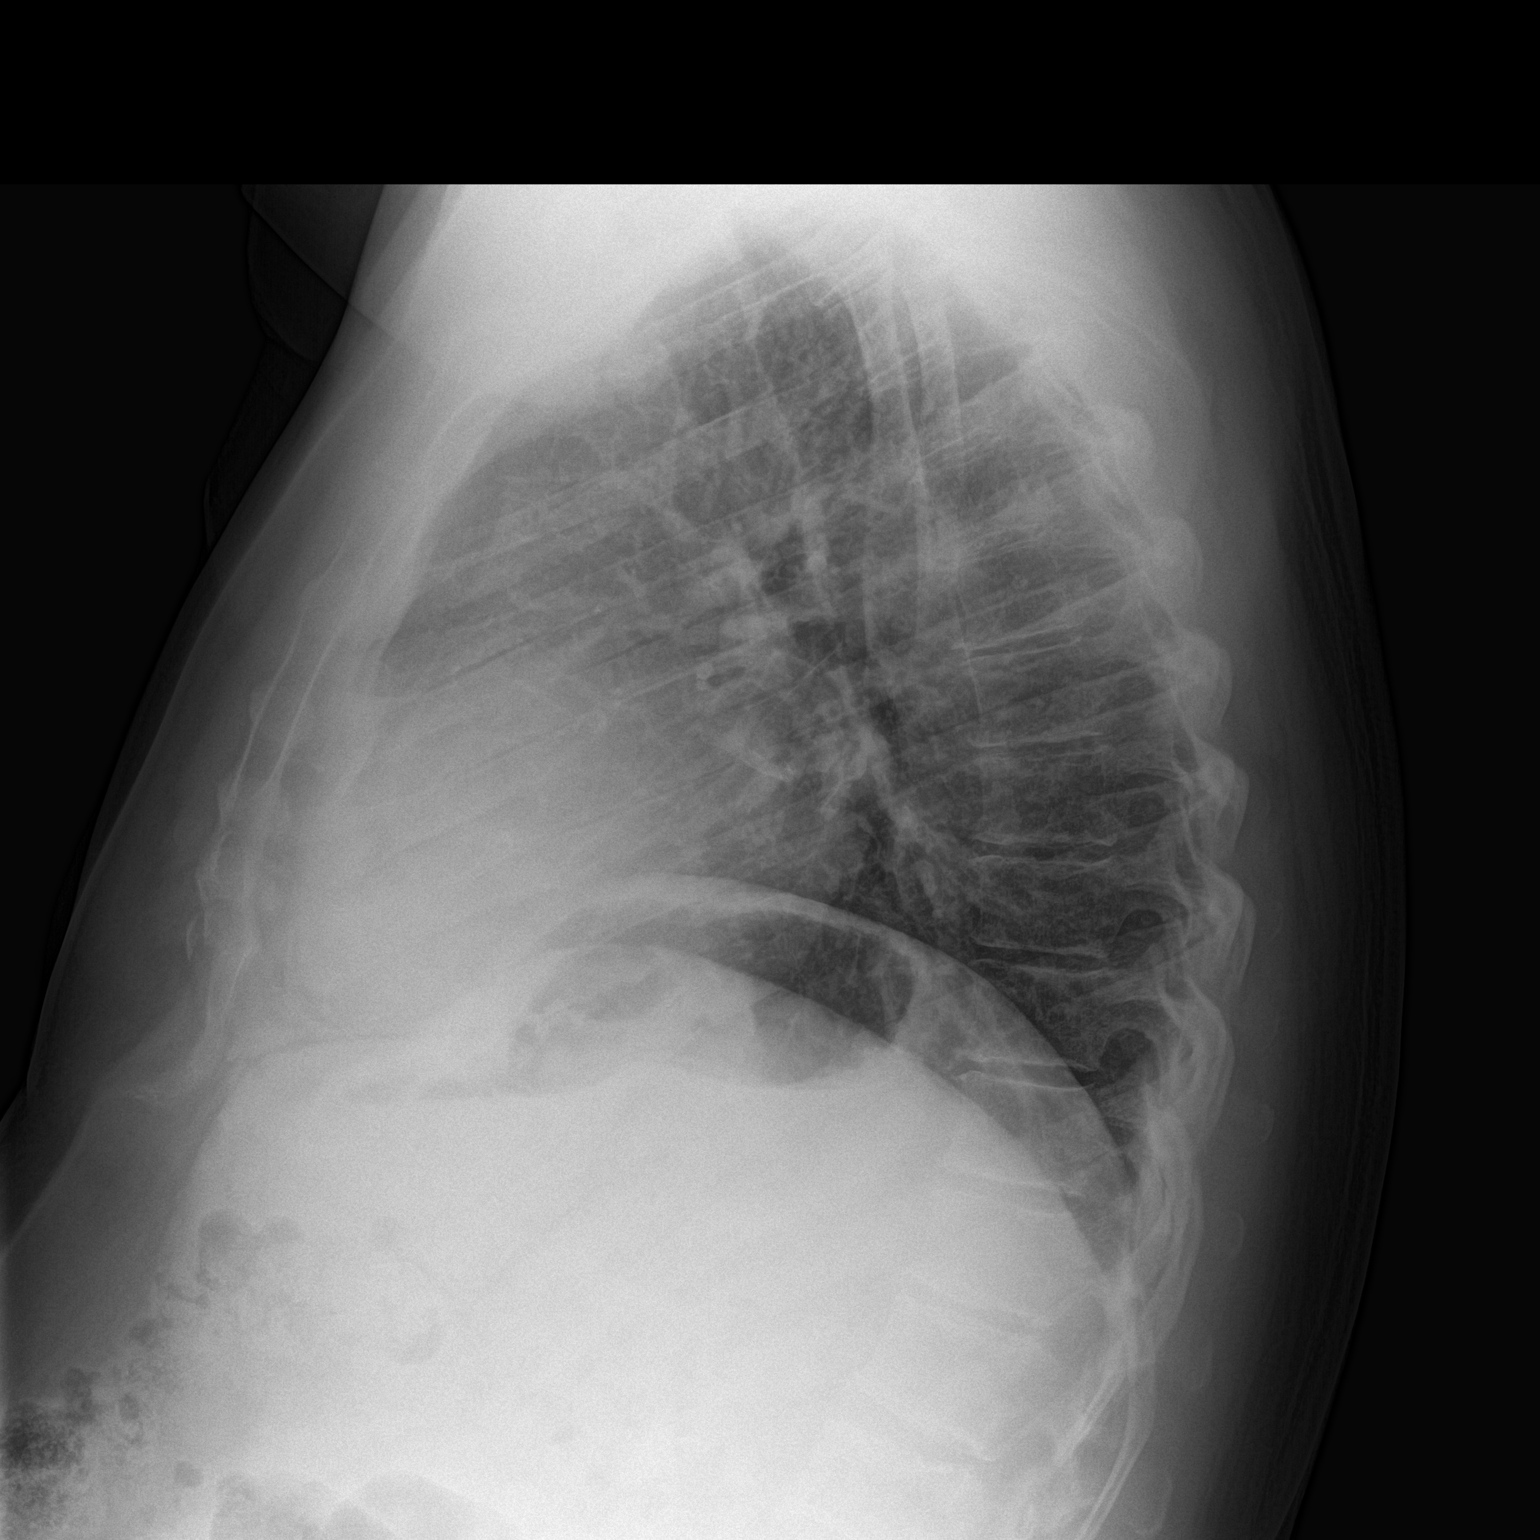

[2 of 2 positions shown; findings below may reference images not displayed]

FINDINGS: The lungs are borderline hypoinflated but clear. The cardiac
silhouette is enlarged. The pulmonary vascularity is normal. The
mediastinum is normal in width. The trachea is midline. The bony
thorax exhibits no acute abnormality.
IMPRESSION: There is no active cardiopulmonary disease. There is stable
cardiomegaly.

## 2016-07-17 ENCOUNTER — Ambulatory Visit: Payer: BLUE CROSS/BLUE SHIELD | Admitting: Medical

## 2017-01-16 IMAGING — DX DG ANKLE COMPLETE 3+V*R*
3 series · 3 of 3 positions shown · non-contrast
Comparison: None in PACs

CLINICAL DATA: Right ankle pain and swelling with length; injury of
the right leg in the recent past; diabetic neuropathy.

EXAM:
RIGHT ANKLE - COMPLETE 3+ VIEW

[ankle ap]
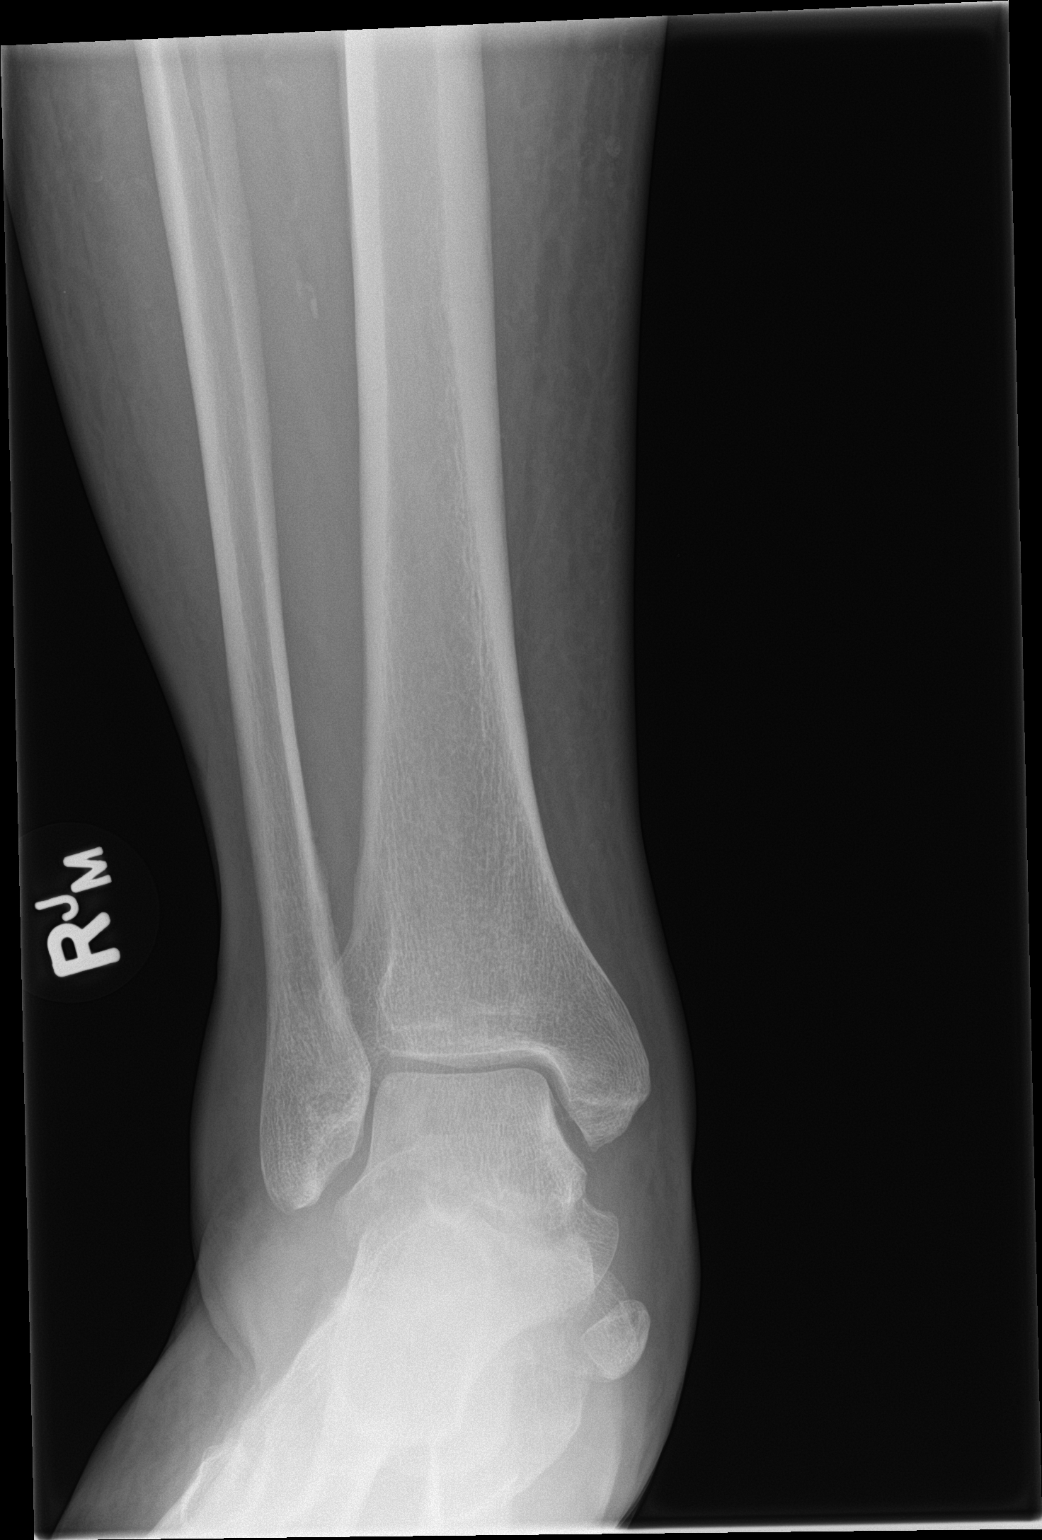

[ankle obl]
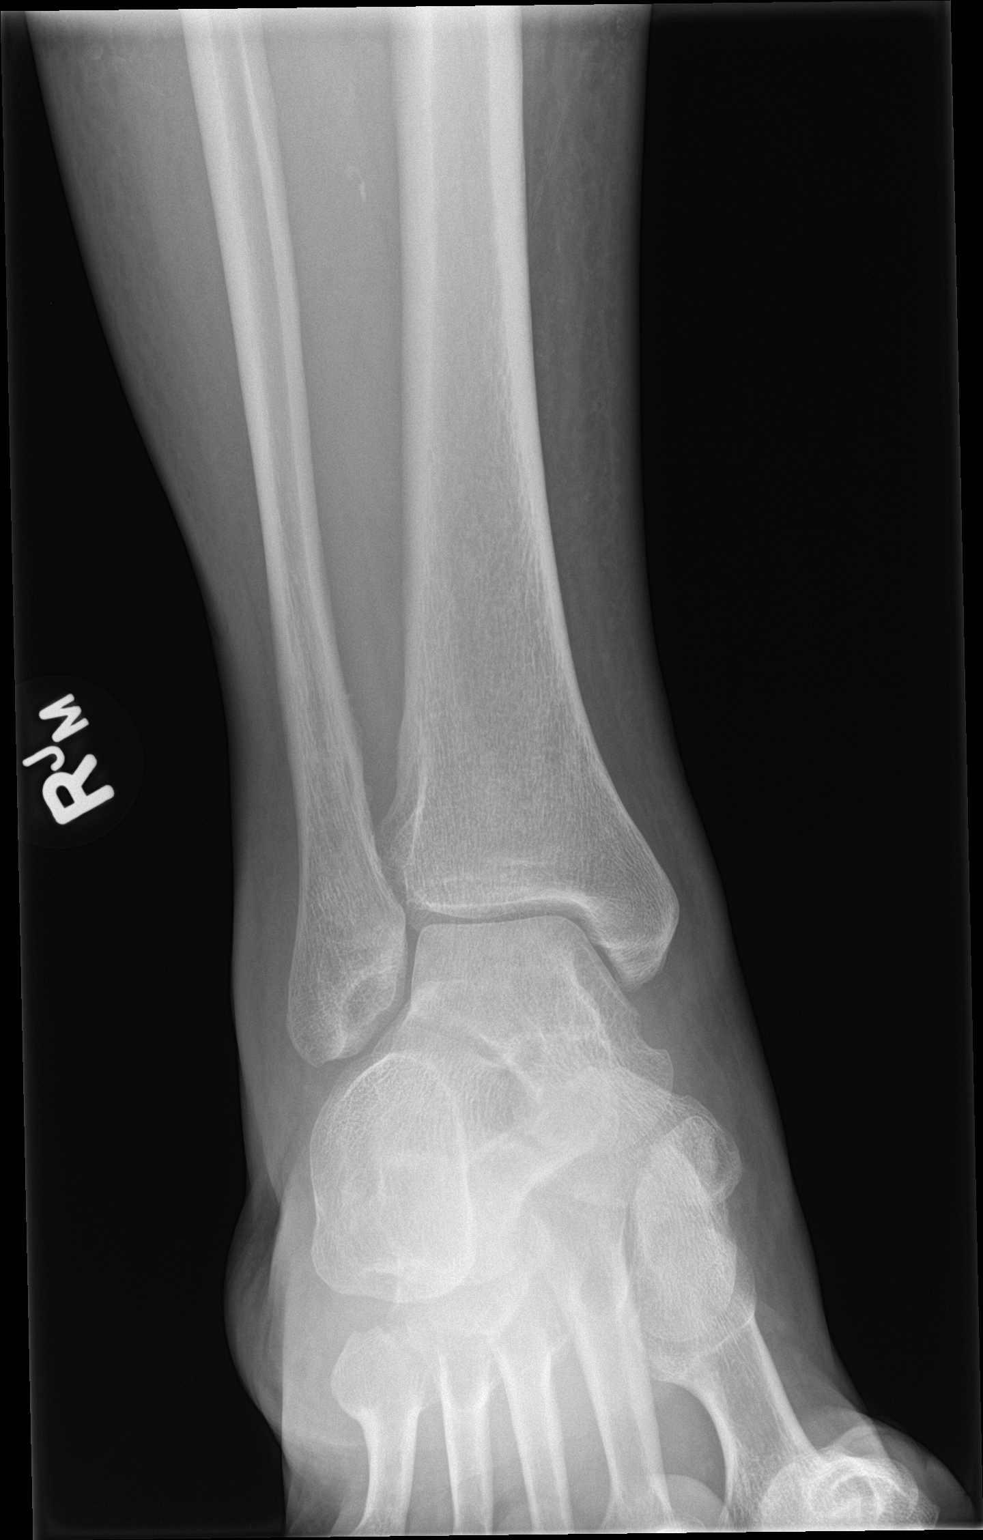

[ankle lat]
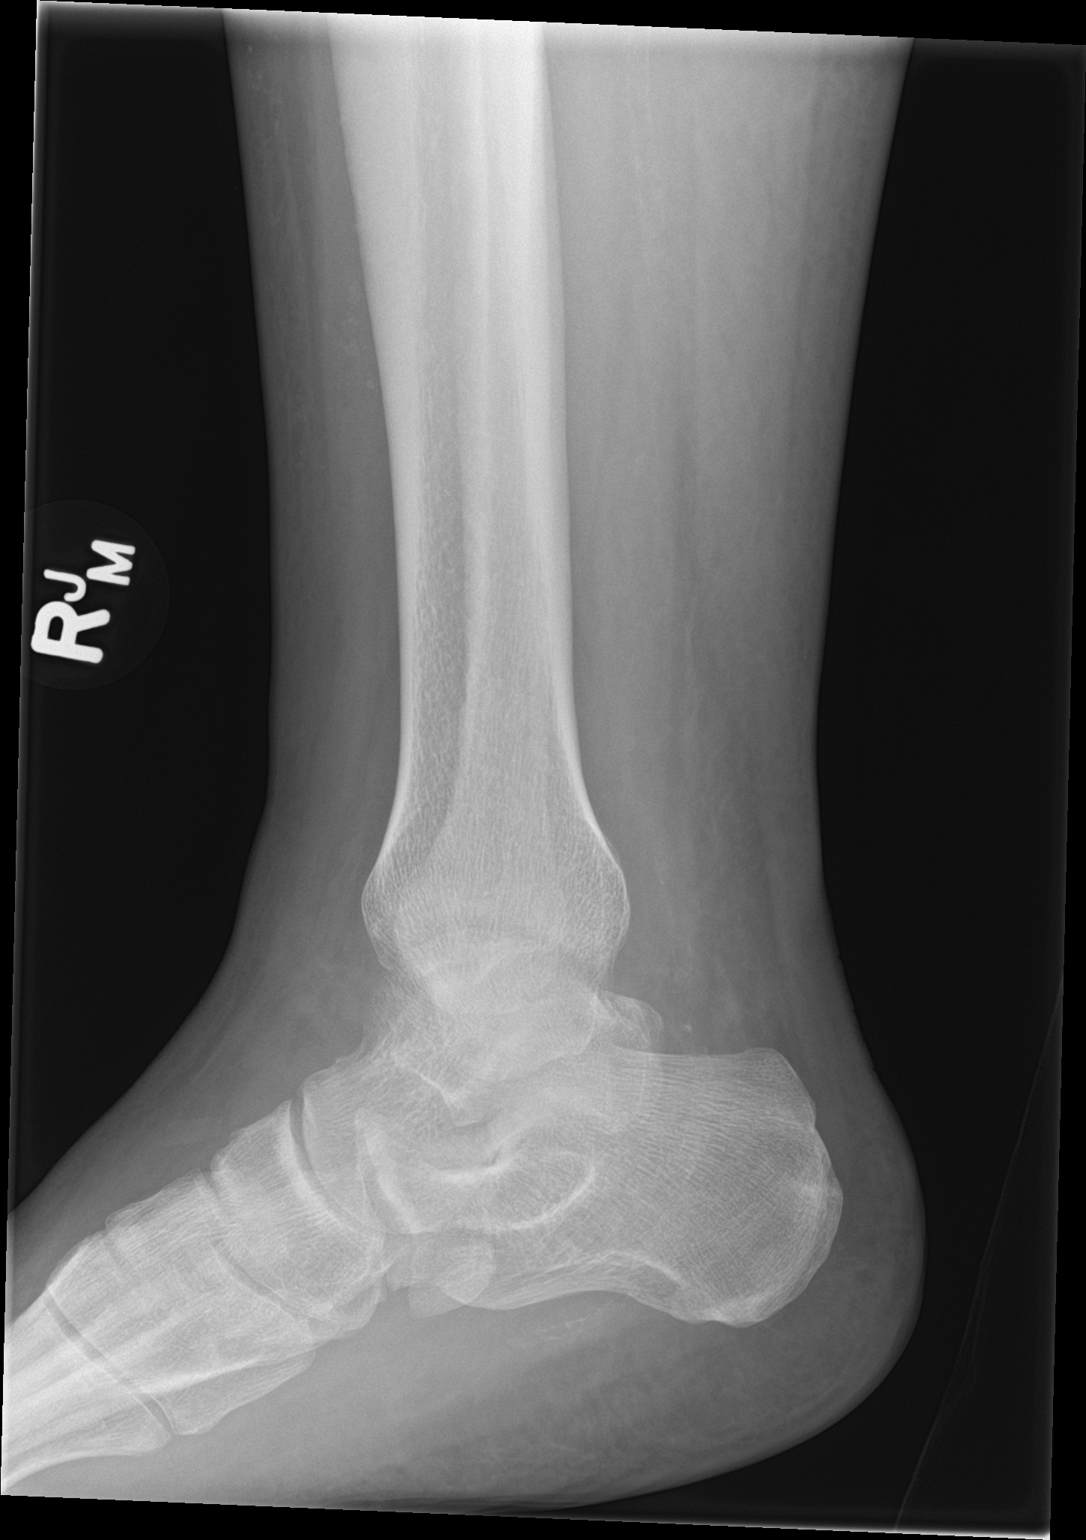

[3 of 3 positions shown; findings below may reference images not displayed]

FINDINGS: The bones are adequately mineralized. There is no lytic nor blastic
lesion or periosteal reaction. The ankle joint mortise is preserved.
The talar dome is intact. No acute malleolar abnormality is
observed. There is moderate diffuse soft tissue swelling about the
ankle. The tarsals are normal where visualized. There is a prominent
os tibialis externum accessory ossicle.
IMPRESSION: There is diffuse soft tissue swelling. No underlying bony
abnormality is demonstrated.

## 2018-05-31 ENCOUNTER — Other Ambulatory Visit: Payer: Self-pay | Admitting: Cardiology

## 2018-06-03 ENCOUNTER — Telehealth: Payer: Self-pay | Admitting: Cardiology

## 2018-06-03 NOTE — Telephone Encounter (Signed)
Called patient and left VM for him to call and schedule followup with Dr. Herbie Baltimore.  The patient has not been seen since 02-10-16.

## 2019-03-17 ENCOUNTER — Telehealth: Payer: Self-pay | Admitting: Medical

## 2019-03-17 NOTE — Telephone Encounter (Signed)
LVM for pt to schedule an appt since has not been seen from 2017. Informing pt before the 3 year limit.
